# Patient Record
Sex: Female | Born: 1975 | Hispanic: Yes | Marital: Married | Smoking: Former smoker
Health system: Southern US, Community
[De-identification: ages and names within clinical notes are randomized; demographics above are authoritative.]

## PROBLEM LIST (undated history)

## (undated) DIAGNOSIS — T7840XA Allergy, unspecified, initial encounter: Secondary | ICD-10-CM

## (undated) DIAGNOSIS — N912 Amenorrhea, unspecified: Secondary | ICD-10-CM

## (undated) HISTORY — DX: Amenorrhea, unspecified: N91.2

## (undated) HISTORY — PX: OTHER SURGICAL HISTORY: SHX169

## (undated) HISTORY — DX: Allergy, unspecified, initial encounter: T78.40XA

---

## 2005-12-22 HISTORY — PX: TUBAL LIGATION: SHX77

## 2016-06-23 ENCOUNTER — Emergency Department (HOSPITAL_COMMUNITY)
Admission: EM | Admit: 2016-06-23 | Discharge: 2016-06-23 | Disposition: A | Discharge status: Home / Self Care | Payer: BLUE CROSS/BLUE SHIELD | Attending: Emergency Medicine | Admitting: Emergency Medicine

## 2016-06-23 ENCOUNTER — Encounter (HOSPITAL_COMMUNITY): Payer: Self-pay | Admitting: Emergency Medicine

## 2016-06-23 ENCOUNTER — Emergency Department (HOSPITAL_COMMUNITY): Payer: BLUE CROSS/BLUE SHIELD

## 2016-06-23 DIAGNOSIS — Y999 Unspecified external cause status: Secondary | ICD-10-CM | POA: Diagnosis not present

## 2016-06-23 DIAGNOSIS — S8392XA Sprain of unspecified site of left knee, initial encounter: Secondary | ICD-10-CM | POA: Insufficient documentation

## 2016-06-23 DIAGNOSIS — Y93B9 Activity, other involving muscle strengthening exercises: Secondary | ICD-10-CM | POA: Insufficient documentation

## 2016-06-23 DIAGNOSIS — Y929 Unspecified place or not applicable: Secondary | ICD-10-CM | POA: Insufficient documentation

## 2016-06-23 DIAGNOSIS — X500XXA Overexertion from strenuous movement or load, initial encounter: Secondary | ICD-10-CM | POA: Diagnosis not present

## 2016-06-23 DIAGNOSIS — S8992XA Unspecified injury of left lower leg, initial encounter: Secondary | ICD-10-CM | POA: Diagnosis present

## 2016-06-23 MED ORDER — KETOROLAC TROMETHAMINE 15 MG/ML IJ SOLN
15.0000 mg | Freq: Once | INTRAMUSCULAR | Status: AC
  Administered 2016-06-23: 15 mg via INTRAMUSCULAR
  Filled 2016-06-23: qty 1

## 2016-06-23 MED ORDER — HYDROCODONE-ACETAMINOPHEN 5-325 MG PO TABS: 1.0000 | ORAL_TABLET | Freq: Four times a day (QID) | ORAL | Status: DC | PRN

## 2016-06-23 MED ORDER — HYDROCODONE-ACETAMINOPHEN 5-325 MG PO TABS
1.0000 | ORAL_TABLET | Freq: Once | ORAL | Status: AC
  Administered 2016-06-23: 1 via ORAL
  Filled 2016-06-23: qty 1

## 2016-06-23 MED ORDER — IBUPROFEN 600 MG PO TABS: 600.0000 mg | ORAL_TABLET | Freq: Four times a day (QID) | ORAL | Status: DC | PRN

## 2016-06-23 NOTE — ED Provider Notes (Signed)
CSN: [id number]     Arrival date & time 06/23/16  0428 History   First MD Initiated Contact with Patient 06/23/16 [phone]     Chief Complaint  Patient presents with  . Knee Injury     (Consider location/radiation/quality/duration/timing/severity/associated sxs/prior Treatment) HPI  This is a 40 year old female with no significant past medical history who presents with left knee pain. Patient reports that she did cross fit for the first time yesterday. 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  Patient reports after the workout she had progressive left knee pain. Pain located posterior and laterally over the knee. She states when she bears weight is 10 out of 10. She took 600 mg of ibuprofen with minimal relief. Pain worsened throughout the night. She denies any obvious injuries.  History reviewed. No pertinent past medical history. History reviewed. No pertinent past surgical history. No family history on file. Social History  Substance Use Topics  . Smoking status: Never Smoker   . Smokeless tobacco: None  . Alcohol Use: No   OB History    No data available     Review of Systems  Musculoskeletal:       Left knee pain  Neurological: Negative for weakness and numbness.  All other systems reviewed and are negative.     Allergies  Review of patient's allergies indicates no known allergies.  Home Medications   Prior to Admission medications   Medication Sig Start Date End Date Taking? Authorizing Provider  HYDROcodone-acetaminophen (NORCO/VICODIN) 5-325 MG tablet Take 1 tablet by mouth every 6 (six) hours as needed for moderate pain. 06/23/16   Shon Batonourtney F Zaylee Cornia, MD  ibuprofen (ADVIL,MOTRIN) 600 MG tablet Take 1 tablet (600 mg total) by mouth every 6 (six) hours as needed. 06/23/16   Shon Batonourtney F Karra Pink, MD   BP 103/64 mmHg  Pulse 68  Temp(Src) 98.4 F (36.9 C) (Oral)  Resp 18  SpO2 98%  LMP 06/04/2016 (Exact Date) Physical Exam  Constitutional:  She is oriented to person, place, and time. She appears well-developed and well-nourished.  HENT:  Head: Normocephalic and atraumatic.  Cardiovascular: Normal rate and regular rhythm.   Pulmonary/Chest: Effort normal. No respiratory distress.  Musculoskeletal:  Normal range of motion of the left knee, pain with extension, tenderness palpation over the lateral joint line and distal IT band, no obvious swelling or overlying skin changes, no significant joint laxity noted  Neurological: She is alert and oriented to person, place, and time.  Skin: Skin is warm and dry.  Psychiatric: She has a normal mood and affect.  Nursing note and vitals reviewed.   ED Course  Procedures (including critical care time) Labs Review Labs Reviewed - No data to display  Imaging Review Dg Knee Complete 4 Views Left  06/23/2016  CLINICAL DATA:  Initial evaluation for recent knee injury. Now with difficulty bearing weight, late extension. EXAM: LEFT KNEE - COMPLETE 4+ VIEW COMPARISON:  None. FINDINGS: No evidence of fracture, dislocation, or joint effusion. No evidence of arthropathy or other focal bone abnormality. Soft tissues are unremarkable. IMPRESSION: No acute osseous abnormality about the knee. Electronically Signed   By: Rise MuBenjamin  McClintock M.D.   On: 06/23/2016 05:55   I have personally reviewed and evaluated these images and lab results as part of my medical decision-making.   EKG Interpretation None      MDM   Final diagnoses:  Knee sprain, left, initial encounter    Patient presents with left knee pain. Nontoxic on exam.  X-rays negative for fracture. Suspect sprain versus ligament injury versus meniscus injury. Patient had improvement of pain with ibuprofen and Norco. She is able to bear weight but continues to endorse pain with weightbearing. Supportive measures encouraged including rest, ice, compression, elevation, anti-inflammatory medications. She will also be given a short course of  Norco. Follow-up with primary physician in one week if not improving. Patient may need an outpatient MRI for definitive evaluation.  After history, exam, and medical workup I feel the patient has been appropriately medically screened and is safe for discharge home. Pertinent diagnoses were discussed with the patient. Patient was given return precautions.     Shon Batonourtney F Amarrah Meinhart, MD 06/23/16 [phone]

## 2016-06-23 NOTE — ED Notes (Signed)
Pt and family understood dc material. NAD noted. Script, crutcheas and paperwork given at DC

## 2016-06-23 NOTE — ED Notes (Signed)
Pt was doing crossfit. Knee was sore afterwards but she could bear weight. Today pt unable to bear weight to left knee

## 2016-06-23 NOTE — Discharge Instructions (Signed)

## 2017-06-30 ENCOUNTER — Ambulatory Visit (INDEPENDENT_AMBULATORY_CARE_PROVIDER_SITE_OTHER): Payer: BLUE CROSS/BLUE SHIELD | Admitting: Family Medicine

## 2017-06-30 ENCOUNTER — Encounter: Payer: Self-pay | Admitting: Family Medicine

## 2017-06-30 VITALS — BP 106/76 | HR 78 | Resp 12 | Ht 63.0 in | Wt 124.5 lb

## 2017-06-30 DIAGNOSIS — J309 Allergic rhinitis, unspecified: Secondary | ICD-10-CM

## 2017-06-30 DIAGNOSIS — L709 Acne, unspecified: Secondary | ICD-10-CM

## 2017-06-30 MED ORDER — ERYTHROMYCIN 2 % EX SOLN: Freq: Every day | CUTANEOUS | 2 refills | Status: DC

## 2017-06-30 MED ORDER — TRETINOIN 0.025 % EX CREA: TOPICAL_CREAM | Freq: Every day | CUTANEOUS | 1 refills | Status: DC

## 2017-06-30 NOTE — Progress Notes (Signed)
HPI:   Karen Johns is a 41 y.o. female, who is here today to establish care with me.  Former PCP: N/A Last preventive routine visit: 2015.  Chronic medical problems: Otherwise healthy except for mild allergies, for which she takes OTC Cetirizine 10 mg as needed.  She follows a healthy diet and rigorous exercise program, she is a Clinical cytogeneticist.  Concerns today: Facial skin lesions, intermittently for 1-2 years, she thinks it is related to allergies because she has had it since she moved to [place]. She has some acne on mandibular areas with her menstrual periods but frontal lesions are present intermittently all month. She tries to wash her face frequently and uses OTC moisturizers but these have not helped.  She has not started new medication, detergent, soap, or body product. No known insect bite or outdoor exposures to plants.   No associated oral lesions/edema,cough, wheezing, dyspnea, abdominal pain, nausea, or vomiting.   Review of Systems  Constitutional: Negative for appetite change, chills, fatigue and fever.  HENT: Positive for postnasal drip and sinus pressure (occasional). Negative for ear pain, mouth sores, nosebleeds, sneezing, sore throat, trouble swallowing and voice change.   Eyes: Negative for discharge and redness.  Respiratory: Negative for cough, shortness of breath and wheezing.   Cardiovascular: Negative for chest pain, palpitations and leg swelling.  Gastrointestinal: Negative for abdominal pain, diarrhea, nausea and vomiting.  Endocrine: Negative for cold intolerance and heat intolerance.  Genitourinary: Negative for decreased urine volume and menstrual problem.  Musculoskeletal: Negative for arthralgias and myalgias.  Skin: Positive for rash.  Allergic/Immunologic: Positive for environmental allergies. Negative for food allergies.  Neurological: Negative for weakness and headaches.  Hematological: Negative for  adenopathy. Does not bruise/bleed easily.  Psychiatric/Behavioral: Negative for sleep disturbance. The patient is not nervous/anxious.      No current outpatient prescriptions on file prior to visit.   No current facility-administered medications on file prior to visit.      Past Medical History:  Diagnosis Date  . Allergy    Allergies  Allergen Reactions  . Shellfish Allergy     Family History  Problem Relation Age of Onset  . Diabetes Father   . Diabetes Paternal Aunt   . Diabetes Paternal Uncle   . Cancer Maternal Grandmother        breast  . Diabetes Paternal Grandfather     Social History   Social History  . Marital status: Single    Spouse name: N/A  . Number of children: N/A  . Years of education: N/A   Social History Main Topics  . Smoking status: Former Games developer  . Smokeless tobacco: Never Used  . Alcohol use No  . Drug use: No  . Sexual activity: Yes    Birth control/ protection: Surgical   Other Topics Concern  . None   Social History Narrative  . None    Vitals:   06/30/17 1435  BP: 106/76  Pulse: 78  Resp: 12   O2 sat at RA 97% Body mass index is 22.05 kg/m.   Physical Exam  Nursing note and vitals reviewed. Constitutional: She is oriented to person, place, and time. She appears well-developed and well-nourished. No distress.  HENT:  Head: Atraumatic.  Mouth/Throat: Oropharynx is clear and moist and mucous membranes are normal.  Eyes: Pupils are equal, round, and reactive to light. Conjunctivae and EOM are normal.  Neck: No tracheal deviation present. No thyroid mass and no thyromegaly  present.  Cardiovascular: Normal rate and regular rhythm.   No murmur heard. Pulses:      Dorsalis pedis pulses are 2+ on the right side, and 2+ on the left side.  Respiratory: Effort normal and breath sounds normal. No respiratory distress.  GI: Soft. She exhibits no mass. There is no hepatomegaly. There is no tenderness.  Musculoskeletal: She  exhibits no edema or tenderness.  Lymphadenopathy:    She has no cervical adenopathy.  Neurological: She is alert and oriented to person, place, and time. She has normal strength. Coordination and gait normal.  Skin: Skin is warm. Rash noted. No ecchymosis noted. Rash is papular and pustular. No erythema.     Small pustular lesions scattered on frontal and temporal areas. Scarring changes on checks.  Psychiatric: She has a normal mood and affect.  Well groomed, good eye contact.      ASSESSMENT AND PLAN:   Ms. Karen Johns was seen today for establish care.  Diagnoses and all orders for this visit:  Acne, unspecified acne type  We discussed Dx and treatment options, will start with topical abx and Retinol. Some side effects discussed. Sun screen and mechanical protection from direct sun light also encouraged. Cetaphil cleanser recommended. F/U in 3 months.  -     erythromycin with ethanol (THERAMYCIN) 2 % external solution; Apply topically daily. -     tretinoin (RETIN-A) 0.025 % cream; Apply topically at bedtime.  Allergic rhinitis, unspecified seasonality, unspecified trigger  Mild. Continue OTC Cetirizine 10 mg as needed. F/U as needed.     Shacola Schussler G. [place], MD  [facility]. [place] office.

## 2017-06-30 NOTE — Patient Instructions (Addendum)
A few things to remember from today's visit:   Acne, unspecified acne type - Plan: erythromycin with ethanol (THERAMYCIN) 2 % external solution, tretinoin (RETIN-A) 0.025 % cream  Sun screen.  Please be sure medication list is accurate. If a new problem present, please set up appointment sooner than planned today.

## 2017-07-03 ENCOUNTER — Telehealth: Payer: Self-pay

## 2017-07-03 NOTE — Telephone Encounter (Signed)
Received PA request for Tretinoin. PA submitted & approved. Form faxed back to pharmacy. 

## 2017-09-28 NOTE — Progress Notes (Signed)
HPI:  Karen Johns is a 41 y.o.female here today for his routine physical examination.  Last CPE: 07/2013 He lives with her fiance.  Regular exercise 3 or more times per week: Yes, she follows a rigorous exercise program. She is getting ready for body builder competition in 10/2017, following 1700 cal/day diet. Following a healthy diet: Yes.   Chronic medical problems: Allergic rhinitis and acne.  Currently she is on topical Erythromycin and Tretinoin, tolerating both medications well. Facial acne well controlled with these medications.  Hx of STD's: Denies Last pap smear: 07/2013 with neg HPV Hx of abnormal pap smears: Denies  LMP: 2 weeks ago. G: 2 L: 2 She breast fed. S/P BTL.  Denies high alcohol intake or Hx of illicit drug use. Former smoker.  She has no concerns today.   Review of Systems  Constitutional: Negative for appetite change, fatigue, fever and unexpected weight change.  HENT: Negative for dental problem, hearing loss, nosebleeds, sore throat, trouble swallowing and voice change.   Eyes: Negative for redness and visual disturbance.  Respiratory: Negative for cough, shortness of breath and wheezing.   Cardiovascular: Negative for chest pain, palpitations and leg swelling.  Gastrointestinal: Negative for abdominal pain, blood in stool, nausea and vomiting.       No changes in bowel habits.  Endocrine: Negative for cold intolerance, heat intolerance, polydipsia, polyphagia and polyuria.  Genitourinary: Negative for decreased urine volume, dysuria, hematuria, menstrual problem, vaginal bleeding and vaginal discharge.       No breast tenderness or nipple discharge.  Musculoskeletal: Negative for arthralgias and back pain.  Skin: Negative for pallor and rash.  Allergic/Immunologic: Positive for environmental allergies.  Neurological: Negative for syncope, weakness, numbness and headaches.  Hematological: Negative for adenopathy. Does not bruise/bleed  easily.  Psychiatric/Behavioral: Negative for confusion and sleep disturbance. The patient is not nervous/anxious.   All other systems reviewed and are negative.    No current outpatient prescriptions on file prior to visit.   No current facility-administered medications on file prior to visit.      Past Medical History:  Diagnosis Date  . Allergy     Past Surgical History:  Procedure Laterality Date  . CESAREAN SECTION     02/03/2004 and 12/22/2005  . TUBAL LIGATION Bilateral 12/22/2005    Allergies  Allergen Reactions  . Shellfish Allergy     Family History  Problem Relation Age of Onset  . Diabetes Father   . Diabetes Paternal Aunt   . Diabetes Paternal Uncle   . Cancer Maternal Grandmother        breast  . Diabetes Paternal Grandfather     Social History   Social History  . Marital status: Single    Spouse name: N/A  . Number of children: N/A  . Years of education: N/A   Social History Main Topics  . Smoking status: Former Games developer  . Smokeless tobacco: Never Used  . Alcohol use No  . Drug use: No  . Sexual activity: Yes    Birth control/ protection: Surgical   Other Topics Concern  . None   Social History Narrative  . None     Vitals:   09/29/17 0854  BP: 102/70  Pulse: 68  Resp: 12  SpO2: 97%   Body mass index is 21.48 kg/m.   Wt Readings from Last 3 Encounters:  09/29/17 121 lb 4 oz (55 kg)  06/30/17 124 lb 8 oz (56.5 kg)    Physical  Exam  Nursing note and vitals reviewed. Constitutional: She is oriented to person, place, and time. She appears well-developed and well-nourished. No distress.  HENT:  Head: Normocephalic and atraumatic.  Right Ear: Hearing, tympanic membrane, external ear and ear canal normal.  Left Ear: Hearing, tympanic membrane, external ear and ear canal normal.  Mouth/Throat: Uvula is midline, oropharynx is clear and moist and mucous membranes are normal.  Eyes: Pupils are equal, round, and reactive to light.  Conjunctivae and EOM are normal.  Neck: No tracheal deviation present. No thyroid mass and no thyromegaly present.  Cardiovascular: Normal rate and regular rhythm.   No murmur heard. Pulses:      Dorsalis pedis pulses are 2+ on the right side, and 2+ on the left side.  Respiratory: Effort normal and breath sounds normal. No respiratory distress.  GI: Soft. She exhibits no mass. There is no hepatomegaly. There is no tenderness.  Genitourinary: No breast swelling or tenderness.  Genitourinary Comments: Breast: Mild fibrocystic changes in outer upper quadrants bilateral. No nipple discharge or skin changes.  Musculoskeletal: She exhibits no edema or tenderness.  No major deformity or signs of synovitis appreciated.  Lymphadenopathy:    She has no cervical adenopathy.    She has no axillary adenopathy.       Right: No supraclavicular adenopathy present.       Left: No supraclavicular adenopathy present.  Neurological: She is alert and oriented to person, place, and time. She has normal strength. No cranial nerve deficit or sensory deficit. Coordination and gait normal.  Reflex Scores:      Bicep reflexes are 2+ on the right side and 2+ on the left side.      Patellar reflexes are 2+ on the right side and 2+ on the left side. Skin: Skin is warm. No rash noted. No erythema.  Several tattoos on skin.  Psychiatric: She has a normal mood and affect. Cognition and memory are normal.  Well groomed, good eye contact.    ASSESSMENT AND PLAN:   Ms. Odaliz was seen today for annual exam.  Diagnoses and all orders for this visit:  Routine general medical examination at a health care facility  We discussed the importance of regular physical activity and healthy diet for prevention of chronic illness and/or complications. Preventive guidelines reviewed. She is due for pap smear next year. Instructed to let me know if she has any pelvic pain,chnages in menses,or discharge. Vaccination up to date.  Refused influenza vaccine. Next CPE in a year.  Screening for lipid disorders -     Lipid panel  Diabetes mellitus screening -     Basic metabolic panel  Encounter for screening for HIV -     HIV antibody  Acne, unspecified acne type  Well controlled. No changes in current management. F/U in 12 months.  -     erythromycin with ethanol (THERAMYCIN) 2 % external solution; Apply topically daily. -     tretinoin (RETIN-A) 0.025 % cream; Apply topically at bedtime.    Return in 1 year (on 09/29/2018) for CPE with pap.    Betty G. [place], MD  [facility]. [place] office.

## 2017-09-29 ENCOUNTER — Ambulatory Visit (INDEPENDENT_AMBULATORY_CARE_PROVIDER_SITE_OTHER): Payer: BLUE CROSS/BLUE SHIELD | Admitting: Family Medicine

## 2017-09-29 ENCOUNTER — Encounter: Payer: Self-pay | Admitting: Family Medicine

## 2017-09-29 VITALS — BP 102/70 | HR 68 | Resp 12 | Ht 63.0 in | Wt 121.2 lb

## 2017-09-29 DIAGNOSIS — Z131 Encounter for screening for diabetes mellitus: Secondary | ICD-10-CM

## 2017-09-29 DIAGNOSIS — Z0000 Encounter for general adult medical examination without abnormal findings: Secondary | ICD-10-CM

## 2017-09-29 DIAGNOSIS — Z13220 Encounter for screening for lipoid disorders: Secondary | ICD-10-CM | POA: Diagnosis not present

## 2017-09-29 DIAGNOSIS — L709 Acne, unspecified: Secondary | ICD-10-CM

## 2017-09-29 DIAGNOSIS — Z114 Encounter for screening for human immunodeficiency virus [HIV]: Secondary | ICD-10-CM | POA: Diagnosis not present

## 2017-09-29 LAB — BASIC METABOLIC PANEL
BUN: 26 mg/dL — ABNORMAL HIGH (ref 6–23)
CO2: 27 mEq/L (ref 19–32)
Calcium: 8.8 mg/dL (ref 8.4–10.5)
Chloride: 104 mEq/L (ref 96–112)
Creatinine, Ser: 1.2 mg/dL (ref 0.40–1.20)
GFR: 53.08 mL/min — ABNORMAL LOW (ref >60.00)
Glucose, Bld: 86 mg/dL (ref 70–99)
Potassium: 4.4 mEq/L (ref 3.5–5.1)
Sodium: 139 mEq/L (ref 135–145)

## 2017-09-29 LAB — LIPID PANEL
Cholesterol: 152 mg/dL (ref 0–200)
HDL: 65.4 mg/dL (ref >39.00)
LDL Cholesterol: 78 mg/dL (ref 0–99)
NonHDL: 86.37
Total CHOL/HDL Ratio: 2
Triglycerides: 41 mg/dL (ref 0.0–149.0)
VLDL: 8.2 mg/dL (ref 0.0–40.0)

## 2017-09-29 MED ORDER — ERYTHROMYCIN 2 % EX SOLN: Freq: Every day | CUTANEOUS | 6 refills | Status: DC

## 2017-09-29 MED ORDER — TRETINOIN 0.025 % EX CREA: TOPICAL_CREAM | Freq: Every day | CUTANEOUS | 3 refills | Status: DC

## 2017-09-29 NOTE — Patient Instructions (Addendum)
A few things to remember from today's visit:   Routine general medical examination at a health care facility  Screening for lipid disorders - Plan: Lipid panel  Diabetes mellitus screening - Plan: Basic metabolic panel  Encounter for screening for HIV - Plan: HIV antibody   Please be sure medication list is accurate. If a new problem present, please set up appointment sooner than planned today.   Today you have you routine preventive visit.  At least 150 minutes of moderate exercise per week, daily brisk walking for 15-30 min is a good exercise option. Healthy diet low in saturated (animal) fats and sweets and consisting of fresh fruits and vegetables, lean meats such as fish and white chicken and whole grains.  These are some of recommendations for screening depending of age and risk factors:   - Vaccines:  Tdap vaccine every 10 years.  Shingles vaccine recommended at age 79, could be given after 39 years of age but not sure about insurance coverage.   Pneumonia vaccines:  Prevnar 13 at 65 and Pneumovax at 66. Sometimes Pneumovax is giving earlier if history of smoking, lung disease,diabetes,kidney disease among some.    Screening for diabetes at age 76 and every 3 years.  Cervical cancer prevention:  Pap smear starts at 39 years of age and continues periodically until 39 years old in low risk women. Pap smear every 3 years between 91 and 31 years old. Pap smear every 3-5 years between women 30 and older if pap smear negative and HPV screening negative.   -Breast cancer: Mammogram: There is disagreement between experts about when to start screening in low risk asymptomatic female but recent recommendations are to start screening at 70 and not later than 39 years old , every 1-2 years and after 39 yo q 2 years. Screening is recommended until 39 years old but some women can continue screening depending of healthy issues.   Colon cancer screening: starts at 39 years old  until 39 years old.  Cholesterol disorder screening at age 65 and every 3 years.  Also recommended:  1. Dental visit- Brush and floss your teeth twice daily; visit your dentist twice a year. 2. Eye doctor- Get an eye exam at least every 2 years. 3. Helmet use- Always wear a helmet when riding a bicycle, motorcycle, rollerblading or skateboarding. 4. Safe sex- If you may be exposed to sexually transmitted infections, use a condom. 5. Seat belts- Seat belts can save your live; always wear one. 6. Smoke/Carbon Monoxide detectors- These detectors need to be installed on the appropriate level of your home. Replace batteries at least once a year. 7. Skin cancer- When out in the sun please cover up and use sunscreen 15 SPF or higher. 8. Violence- If anyone is threatening or hurting you, please tell your healthcare provider.  9. Drink alcohol in moderation- Limit alcohol intake to one drink or less per day. Never drink and drive.

## 2017-09-30 LAB — HIV ANTIBODY (ROUTINE TESTING W REFLEX): HIV 1&2 Ab, 4th Generation: NONREACTIVE

## 2017-10-02 ENCOUNTER — Other Ambulatory Visit: Payer: Self-pay | Admitting: Family Medicine

## 2017-10-02 ENCOUNTER — Encounter: Payer: Self-pay | Admitting: Family Medicine

## 2017-10-02 DIAGNOSIS — R944 Abnormal results of kidney function studies: Secondary | ICD-10-CM

## 2017-11-17 ENCOUNTER — Encounter: Payer: BLUE CROSS/BLUE SHIELD | Admitting: Family Medicine

## 2018-03-27 ENCOUNTER — Ambulatory Visit: Payer: BLUE CROSS/BLUE SHIELD | Admitting: Family Medicine

## 2018-03-27 ENCOUNTER — Encounter: Payer: Self-pay | Admitting: Family Medicine

## 2018-03-27 VITALS — BP 122/66 | HR 85 | Temp 98.8°F | Resp 12 | Ht 63.0 in | Wt 132.4 lb

## 2018-03-27 DIAGNOSIS — G5601 Carpal tunnel syndrome, right upper limb: Secondary | ICD-10-CM

## 2018-03-27 DIAGNOSIS — M79601 Pain in right arm: Secondary | ICD-10-CM

## 2018-03-27 NOTE — Patient Instructions (Signed)
A few things to remember from today's visit:   Carpal tunnel syndrome of right wrist - Plan: Ambulatory referral to Orthopedic Surgery  Pain of right upper extremity   Carpal Tunnel Syndrome Carpal tunnel syndrome is a condition that causes pain in your hand and arm. The carpal tunnel is a narrow area that is on the palm side of your wrist. Repeated wrist motion or certain diseases may cause swelling in the tunnel. This swelling can pinch the main nerve in the wrist (median nerve). Follow these instructions at home: If you have a splint:  Wear it as told by your doctor. Remove it only as told by your doctor.  Loosen the splint if your fingers: ? Become numb and tingle. ? Turn blue and cold.  Keep the splint clean and dry. General instructions  Take over-the-counter and prescription medicines only as told by your doctor.  Rest your wrist from any activity that may be causing your pain. If needed, talk to your employer about changes that can be made in your work, such as getting a wrist pad to use while typing.  If directed, apply ice to the painful area: ? Put ice in a plastic bag. ? Place a towel between your skin and the bag. ? Leave the ice on for 20 minutes, 2-3 times per day.  Keep all follow-up visits as told by your doctor. This is important.  Do any exercises as told by your doctor, physical therapist, or occupational therapist. Contact a doctor if:  You have new symptoms.  Medicine does not help your pain.  Your symptoms get worse. This information is not intended to replace advice given to you by your health care provider. Make sure you discuss any questions you have with your health care provider. Document Released: 11/24/2011 Document Revised: 05/12/2016 Document Reviewed: 04/22/2015 Elsevier Interactive Patient Education  2018 [employer]  Please be sure medication list is accurate. If a new problem present, please set up appointment sooner than planned  today.

## 2018-03-27 NOTE — Progress Notes (Signed)
ACUTE VISIT   HPI:  Chief Complaint  Patient presents with  . Wrist Pain    sx started years ago, have been getting worse since February. Pain and numbness radiates to shoulder sometimes. Unable to sleep due to pain for the last 3 weeks    Karen Johns is a 42 y.o. female, who is here today complaining of worsening right wrist pain and hand numbness. She has had problem intermittently for about 8 years,usually alleviated by shaking her hand and did not have problems during the day. It is progressively getting worse sine 01/2018 and for the past 3 weeks having symptoms during the day: Severe burning and numbness of fingers,hand,wrist and radiated to forearm and arm, under right shoulder.5-7/10, constant. Interfering with her sleep, seems to be worse at night. She has noted that her drip is weaker.  No rash or edema. No recent trauma.  "Tightness" on neck ,no limitation of ROM.  She is a Engineer, production. Right handed.  She has tried Ibuprofen,night splint, bandage,and local ice but neither of these treatment seem to help.    Review of Systems  Constitutional: Negative for chills, fatigue and fever.  HENT: Negative for mouth sores, sore throat and trouble swallowing.   Respiratory: Negative for cough, shortness of breath and wheezing.   Gastrointestinal: Negative for nausea and vomiting.  Musculoskeletal: Positive for arthralgias. Negative for gait problem and joint swelling.  Skin: Negative for pallor and rash.  Neurological: Positive for weakness and numbness. Negative for tremors and headaches.  Psychiatric/Behavioral: Positive for sleep disturbance. Negative for confusion. The patient is not nervous/anxious.       Current Outpatient Medications on File Prior to Visit  Medication Sig Dispense Refill  . erythromycin with ethanol (THERAMYCIN) 2 % external solution Apply topically daily. 60 mL 6  . Multiple Vitamins-Minerals (MULTIVITAMIN ADULT  PO) Take by mouth daily.    Marland Kitchen tretinoin (RETIN-A) 0.025 % cream Apply topically at bedtime. 45 g 3   No current facility-administered medications on file prior to visit.      Past Medical History:  Diagnosis Date  . Allergy    Allergies  Allergen Reactions  . Shellfish Allergy     Social History   Socioeconomic History  . Marital status: Single    Spouse name: Not on file  . Number of children: Not on file  . Years of education: Not on file  . Highest education level: Not on file  Occupational History  . Not on file  Social Needs  . Financial resource strain: Not on file  . Food insecurity:    Worry: Not on file    Inability: Not on file  . Transportation needs:    Medical: Not on file    Non-medical: Not on file  Tobacco Use  . Smoking status: Former Games developer  . Smokeless tobacco: Never Used  Substance and Sexual Activity  . Alcohol use: No  . Drug use: No  . Sexual activity: Yes    Birth control/protection: Surgical  Lifestyle  . Physical activity:    Days per week: Not on file    Minutes per session: Not on file  . Stress: Not on file  Relationships  . Social connections:    Talks on phone: Not on file    Gets together: Not on file    Attends religious service: Not on file    Active member of club or organization: Not on file  Attends meetings of clubs or organizations: Not on file    Relationship status: Not on file  Other Topics Concern  . Not on file  Social History Narrative  . Not on file    Vitals:   03/27/18 1502  BP: 122/66  Pulse: 85  Resp: 12  Temp: 98.8 F (37.1 C)  SpO2: 98%   Body mass index is 23.45 kg/m.    Physical Exam  Nursing note and vitals reviewed. Constitutional: She is oriented to person, place, and time. She appears well-developed and well-nourished. She does not appear ill. No distress.  HENT:  Head: Normocephalic and atraumatic.  Eyes: Conjunctivae are normal.  Cardiovascular: Normal rate and regular  rhythm.  No murmur heard. Pulses:      Radial pulses are 2+ on the right side.  Respiratory: Effort normal and breath sounds normal. No respiratory distress.  Musculoskeletal: She exhibits no edema.       Right shoulder: She exhibits normal range of motion, no tenderness and no bony tenderness.       Right wrist: She exhibits tenderness. She exhibits normal range of motion, no effusion and no deformity.       Cervical back: She exhibits tenderness. She exhibits no bony tenderness.       Back:       Right hand: Normal strength noted.  Tenderness upon palpation of right trapezium and cervical paraspinal muscles, mild. Right tinel and Phalen positive. No muscle atrophy appreciated.  Lymphadenopathy:    She has no cervical adenopathy.  Neurological: She is alert and oriented to person, place, and time. She has normal strength. Gait normal.  Skin: Skin is warm. No rash noted. No erythema.  Psychiatric: She has a normal mood and affect. Her speech is normal.  Well groomed, good eye contact.      ASSESSMENT AND PLAN:   [name] was seen today for wrist pain.  Diagnoses and all orders for this visit:  Pain of right upper extremity  We discussed possible etiologies. ? Radicular pain. After discussion of possible benefits of Prednisone taper ,we decided to hold on this for now. May need cervical MRI if worsening or not better after carpal tunnel treatment.  Carpal tunnel syndrome of right wrist  Further work up options options discussed (EMG) ,she prefers to go ahead with ortho evaluation. Recommend trying to avoid activities that aggravate problem but it is difficult to do so.  -     Ambulatory referral to Orthopedic Surgery      -Ms.Corinna CapraLynn Brunelle was advised to seek immediate medical attention if sudden worsening symptoms.      Destyn Parfitt G. [place], MD  [facility]. [place] office.

## 2018-03-31 ENCOUNTER — Encounter: Payer: Self-pay | Admitting: Family Medicine

## 2018-04-03 ENCOUNTER — Encounter (INDEPENDENT_AMBULATORY_CARE_PROVIDER_SITE_OTHER): Payer: Self-pay | Admitting: Orthopaedic Surgery

## 2018-04-03 ENCOUNTER — Ambulatory Visit (INDEPENDENT_AMBULATORY_CARE_PROVIDER_SITE_OTHER): Payer: BLUE CROSS/BLUE SHIELD | Admitting: Orthopaedic Surgery

## 2018-04-03 ENCOUNTER — Ambulatory Visit (INDEPENDENT_AMBULATORY_CARE_PROVIDER_SITE_OTHER): Payer: BLUE CROSS/BLUE SHIELD

## 2018-04-03 DIAGNOSIS — M79642 Pain in left hand: Secondary | ICD-10-CM | POA: Diagnosis not present

## 2018-04-03 DIAGNOSIS — M79641 Pain in right hand: Secondary | ICD-10-CM

## 2018-04-03 MED ORDER — GABAPENTIN 100 MG PO CAPS: 300.0000 mg | ORAL_CAPSULE | Freq: Every evening | ORAL | 3 refills | Status: DC | PRN

## 2018-04-03 MED ORDER — PREDNISONE 10 MG (21) PO TBPK: ORAL_TABLET | ORAL | 0 refills | Status: DC

## 2018-04-03 NOTE — Progress Notes (Signed)
Office Visit Note   Patient: Karen Johns           Date of Birth: 07/11/1976           MRN: [id number] Visit Date: 04/03/2018              Requested by: Karen Johns, Karen G, MD [address] [place], [place] [address] PCP: Karen Johns, Karen G, MD   Assessment & Plan: Visit Diagnoses:  1. Bilateral hand pain     Plan: Impression is bilateral carpal tunnel syndrome worse on the right.  Neck recommend nerve conduction studies.  Prescription for prednisone and gabapentin to take at night.  Recommend wearing carpal tunnel splints during the daytime also.  Also recommend backing down on her weight lifting and personal training as this likely exacerbates her carpal tunnel syndrome.  Follow-up after the nerve conduction studies.  Follow-Up Instructions: Return if symptoms worsen or fail to improve.   Orders:  Orders Placed This Encounter  Procedures  . Ambulatory referral to Physical Medicine Rehab   Meds ordered this encounter  Medications  . predniSONE (STERAPRED UNI-PAK 21 TAB) 10 MG (21) TBPK tablet    Sig: Take as directed    Dispense:  21 tablet    Refill:  0  . gabapentin (NEURONTIN) 100 MG capsule    Sig: Take 3 capsules (300 mg total) by mouth at bedtime as needed.    Dispense:  30 capsule    Refill:  3      Procedures: No procedures performed   Clinical Data: No additional findings.   Subjective: Chief Complaint  Patient presents with  . 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  This is been worse on the right.  Denies any injuries.  She has worn a carpal tunnel splints at night without any significant relief.  She endorses numbness in her median nerve distribution.  She is constantly shaking her hands and dropping objects.  Denies any constitutional symptoms.   Review of Systems  Constitutional:  Negative.   HENT: Negative.   Eyes: Negative.   Respiratory: Negative.   Cardiovascular: Negative.   Endocrine: Negative.   Musculoskeletal: Negative.   Neurological: Negative.   Hematological: Negative.   Psychiatric/Behavioral: Negative.   All other systems reviewed and are negative.    Objective: Vital Signs: There were no vitals taken for this visit.  Physical Exam  Constitutional: She is oriented to person, place, and time. She appears well-developed and well-nourished.  HENT:  Head: Normocephalic and atraumatic.  Eyes: EOM are normal.  Neck: Neck supple.  Pulmonary/Chest: Effort normal.  Abdominal: Soft.  Neurological: She is alert and oriented to person, place, and time.  Skin: Skin is warm. Capillary refill takes less than 2 seconds.  Psychiatric: She has a normal mood and affect. Her behavior is normal. Judgment and thought content normal.  Nursing note and vitals reviewed.   Ortho Exam Exam shows positive Phalen's, Durkin, Tinel's signs.  No muscle atrophy.  No skin lesions. Specialty Comments:  No specialty comments available.  Imaging: No results found.   PMFS History: Patient Active Problem List   Diagnosis Date Noted  . Allergic rhinitis 06/30/2017   Past Medical History:  Diagnosis Date  . Allergy     Family History  Problem Relation Age of Onset  .  Diabetes Father   . Diabetes Paternal Aunt   . Diabetes Paternal Uncle   . Cancer Maternal Grandmother        breast  . Diabetes Paternal Grandfather     Past Surgical History:  Procedure Laterality Date  . CESAREAN SECTION     02/03/2004 and 12/22/2005  . TUBAL LIGATION Bilateral 12/22/2005   Social History   Occupational History  . Not on file  Tobacco Use  . Smoking status: Former Games developermoker  . Smokeless tobacco: Never Used  Substance and Sexual Activity  . Alcohol use: No  . Drug use: No  . Sexual activity: Yes    Birth control/protection: Surgical

## 2018-04-27 ENCOUNTER — Ambulatory Visit (INDEPENDENT_AMBULATORY_CARE_PROVIDER_SITE_OTHER): Payer: BLUE CROSS/BLUE SHIELD | Admitting: Physical Medicine and Rehabilitation

## 2018-04-27 ENCOUNTER — Encounter (INDEPENDENT_AMBULATORY_CARE_PROVIDER_SITE_OTHER): Payer: Self-pay | Admitting: Physical Medicine and Rehabilitation

## 2018-04-27 DIAGNOSIS — R202 Paresthesia of skin: Secondary | ICD-10-CM

## 2018-04-27 NOTE — Progress Notes (Signed)
 .  Numeric Pain Rating Scale and Functional Assessment Average Pain 10   In the last MONTH (on 0-10 scale) has pain interfered with the following?  1. General activity like being  able to carry out your everyday physical activities such as walking, climbing stairs, carrying groceries, or moving a chair?  Rating(6)    

## 2018-04-27 NOTE — Progress Notes (Signed)
Karen Johns - 42 y.o. female MRN [id number]  Date of birth: 06/06/1976  Office Visit Note: Visit Date: 04/27/2018 PCP: [place], Betty G, MD Referred by: [place], Betty G, MD  Subjective: Chief Complaint  Patient presents with  . Right Hand - Numbness, Tingling  . Left Hand - Numbness, Tingling  . Right Forearm - Pain  . Left Forearm - Pain   HPI: Karen Johns is a pleasant 42 year old female, right-hand-dominant, who comes in today at the request of Dr. Roda Shutters for electrodiagnostic study of both upper limbs.  She works as a Systems analyst at [employer] and complains of several months of worsening bilateral hand pain and numbness which refers up to the elbows.  Most of his symptoms are in the radial 3 digits.  She has been wearing splints without much relief.  She reports that she is constantly shaking her hands and has a positive flick sign.  She does report dropping objects at times.  She does report the right is worse than the left.  She reports that the symptoms are really been off and on for at least 8 years but really became significantly worse in January.  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  She also reports that putting her hand down to her side can make the symptoms better.  She gets numbness and tingling while talking on the phone and driving.    ROS Otherwise per HPI.  Assessment & Plan: Visit Diagnoses:  1. Paresthesia of skin     Plan: No additional findings.  Impression: The above electrodiagnostic study is ABNORMAL and reveals evidence of:  1.  A moderate right median nerve entrapment at the wrist (carpal tunnel syndrome) affecting sensory and motor components.   2.  A borderline mild left median nerve entrapment at the wrist (carpal tunnel syndrome) affecting sensory components.   There is no significant electrodiagnostic evidence of any other focal nerve entrapment, brachial plexopathy or cervical radiculopathy.    As you know, this  particular electrodiagnostic study cannot rule out chemical radiculitis or sensory only radiculopathy.  Recommendations: 1.  Follow-up with referring physician. 2.  Continue current management of symptoms. 3.  Continue use of resting splint at night-time and as needed during the day. 4.  Suggest surgical evaluation.   Meds & Orders: No orders of the defined types were placed in this encounter.   Orders Placed This Encounter  Procedures  . NCV with EMG (electromyography)    Follow-up: Return for Dr. Roda Shutters.   Procedures: No procedures performed  EMG & NCV Findings: Evaluation of the right median motor nerve showed prolonged distal onset latency (4.5 ms) and decreased conduction velocity (Elbow-Wrist, 45 m/s).  The left median (across palm) sensory nerve showed prolonged distal peak latency (Wrist, 3.8 ms).  The right median (across palm) sensory nerve showed prolonged distal peak latency (Wrist, 4.7 ms) and prolonged distal peak latency (Palm, 2.9 ms).  All remaining nerves (as indicated in the following tables) were within normal limits.  Left vs. Right side comparison data for the ulnar motor nerve indicates abnormal L-R amplitude difference (29.9 %).  All remaining left vs. right side differences were within normal limits.    Needle evaluation of the right abductor pollicis brevis muscle showed increased insertional activity.  All remaining muscles (as indicated in the following table) showed no evidence of electrical instability.    Impression: The above electrodiagnostic study is ABNORMAL and reveals evidence of:  1.  A moderate  right median nerve entrapment at the wrist (carpal tunnel syndrome) affecting sensory and motor components.   2.  A borderline mild left median nerve entrapment at the wrist (carpal tunnel syndrome) affecting sensory components.   There is no significant electrodiagnostic evidence of any other focal nerve entrapment, brachial plexopathy or cervical  radiculopathy.    As you know, this particular electrodiagnostic study cannot rule out chemical radiculitis or sensory only radiculopathy.  Recommendations: 1.  Follow-up with referring physician. 2.  Continue current management of symptoms. 3.  Continue use of resting splint at night-time and as needed during the day. 4.  Suggest surgical evaluation.  Nerve Conduction Studies Anti Sensory Summary Table   Stim Site NR Peak (ms) Norm Peak (ms) P-T Amp (V) Norm P-T Amp Site1 Site2 Delta-P (ms) Dist (cm) Vel (m/s) Norm Vel (m/s)  Left Median Acr Palm Anti Sensory (2nd Digit)  32.3C  Wrist    *3.8 <3.6 20.0 >10 Wrist Palm 2.0 0.0    Palm    1.8 <2.0 20.5         Right Median Acr Palm Anti Sensory (2nd Digit)  32.4C  Wrist    *4.7 <3.6 22.4 >10 Wrist Palm 1.8 0.0    Palm    *2.9 <2.0 0.1         Left Radial Anti Sensory (Base 1st Digit)  32.5C  Wrist    2.1 <3.1 42.2  Wrist Base 1st Digit 2.1 0.0    Right Radial Anti Sensory (Base 1st Digit)  33C  Wrist    2.2 <3.1 21.7  Wrist Base 1st Digit 2.2 0.0    Left Ulnar Anti Sensory (5th Digit)  32.5C  Wrist    3.3 <3.7 25.7 >15.0 Wrist 5th Digit 3.3 14.0 42 >38  Right Ulnar Anti Sensory (5th Digit)  32.7C  Wrist    3.5 <3.7 29.2 >15.0 Wrist 5th Digit 3.5 14.0 40 >38   Motor Summary Table   Stim Site NR Onset (ms) Norm Onset (ms) O-P Amp (mV) Norm O-P Amp Site1 Site2 Delta-0 (ms) Dist (cm) Vel (m/s) Norm Vel (m/s)  Left Median Motor (Abd Poll Brev)  32.4C  Wrist    4.0 <4.2 10.0 >5 Elbow Wrist 3.7 19.0 51 >50  Elbow    7.7  10.0         Right Median Motor (Abd Poll Brev)  32.7C  Wrist    *4.5 <4.2 7.7 >5 Elbow Wrist 4.2 19.0 *45 >50  Elbow    8.7  7.4         Left Ulnar Motor (Abd Dig Min)  32.5C  Wrist    2.7 <4.2 6.8 >3 B Elbow Wrist 3.2 19.0 59 >53  B Elbow    5.9  7.1  A Elbow B Elbow 1.4 9.0 64 >53  A Elbow    7.3  7.1         Right Ulnar Motor (Abd Dig Min)  32.5C  Wrist    2.7 <4.2 9.7 >3 B Elbow Wrist 3.3 18.0 55 >53   B Elbow    6.0  9.6  A Elbow B Elbow 1.3 9.0 69 >53  A Elbow    7.3  9.5          EMG   Side Muscle Nerve Root Ins Act Fibs Psw Amp Dur Poly Recrt Int Dennie Bible Comment  Right Abd Poll Brev Median C8-T1 *Incr Nml Nml Nml Nml 0 Nml Nml   Right 1stDorInt Ulnar C8-T1  Nml Nml Nml Nml Nml 0 Nml Nml   Right PronatorTeres Median C6-7 Nml Nml Nml Nml Nml 0 Nml Nml   Right Biceps Musculocut C5-6 Nml Nml Nml Nml Nml 0 Nml Nml   Right Deltoid Axillary C5-6 Nml Nml Nml Nml Nml 0 Nml Nml     Nerve Conduction Studies Anti Sensory Left/Right Comparison   Stim Site L Lat (ms) R Lat (ms) L-R Lat (ms) L Amp (V) R Amp (V) L-R Amp (%) Site1 Site2 L Vel (m/s) R Vel (m/s) L-R Vel (m/s)  Median Acr Palm Anti Sensory (2nd Digit)  32.3C  Wrist *3.8 *4.7 0.9 20.0 22.4 10.7 Wrist Palm     Palm 1.8 *2.9 1.1 20.5 0.1 99.5       Radial Anti Sensory (Base 1st Digit)  32.5C  Wrist 2.1 2.2 0.1 42.2 21.7 48.6 Wrist Base 1st Digit     Ulnar Anti Sensory (5th Digit)  32.5C  Wrist 3.3 3.5 0.2 25.7 29.2 12.0 Wrist 5th Digit 42 40 2   Motor Left/Right Comparison   Stim Site L Lat (ms) R Lat (ms) L-R Lat (ms) L Amp (mV) R Amp (mV) L-R Amp (%) Site1 Site2 L Vel (m/s) R Vel (m/s) L-R Vel (m/s)  Median Motor (Abd Poll Brev)  32.4C  Wrist 4.0 *4.5 0.5 10.0 7.7 23.0 Elbow Wrist 51 *45 6  Elbow 7.7 8.7 1.0 10.0 7.4 26.0       Ulnar Motor (Abd Dig Min)  32.5C  Wrist 2.7 2.7 0.0 6.8 9.7 *29.9 B Elbow Wrist 59 55 4  B Elbow 5.9 6.0 0.1 7.1 9.6 26.0 A Elbow B Elbow 64 69 5  A Elbow 7.3 7.3 0.0 7.1 9.5 25.3          Waveforms:                     Clinical History: No specialty comments available.   She reports that she has quit smoking. She has never used smokeless tobacco. No results for input(s): HGBA1C, LABURIC in the last 8760 hours.  Objective:  VS:  HT:    WT:   BMI:     BP:   HR: bpm  TEMP: ( )  RESP:  Physical Exam  Musculoskeletal:  Inspection reveals no atrophy of the bilateral APB or FDI or  hand intrinsics. There is no swelling, color changes, allodynia or dystrophic changes. There is 5 out of 5 strength in the bilateral wrist extension, finger abduction and long finger flexion. There is intact sensation to light touch in all dermatomal and peripheral nerve distributions. There is a negative Phalen's test bilaterally but quicker on right. There is a negative Hoffmann's test bilaterally.    Ortho Exam Imaging: No results found.  Past Medical/Family/Surgical/Social History: Medications & Allergies reviewed per EMR, new medications updated. Patient Active Problem List   Diagnosis Date Noted  . Allergic rhinitis 06/30/2017   Past Medical History:  Diagnosis Date  . Allergy    Family History  Problem Relation Age of Onset  . Diabetes Father   . Diabetes Paternal Aunt   . Diabetes Paternal Uncle   . Cancer Maternal Grandmother        breast  . Diabetes Paternal Grandfather    Past Surgical History:  Procedure Laterality Date  . CESAREAN SECTION     02/03/2004 and 12/22/2005  . TUBAL LIGATION Bilateral 12/22/2005   Social History   Occupational History  . Not on file  Tobacco  Use  . Smoking status: Former Smoker  . Smokeless tobacco: Never Used  Substance and Sexual Activity  . Alcohol use: No  . Drug use: No  . Sexual activity: Yes    Birth control/protection: Surgical

## 2018-04-30 NOTE — Procedures (Signed)
EMG & NCV Findings: Evaluation of the right median motor nerve showed prolonged distal onset latency (4.5 ms) and decreased conduction velocity (Elbow-Wrist, 45 m/s).  The left median (across palm) sensory nerve showed prolonged distal peak latency (Wrist, 3.8 ms).  The right median (across palm) sensory nerve showed prolonged distal peak latency (Wrist, 4.7 ms) and prolonged distal peak latency (Palm, 2.9 ms).  All remaining nerves (as indicated in the following tables) were within normal limits.  Left vs. Right side comparison data for the ulnar motor nerve indicates abnormal L-R amplitude difference (29.9 %).  All remaining left vs. right side differences were within normal limits.    Needle evaluation of the right abductor pollicis brevis muscle showed increased insertional activity.  All remaining muscles (as indicated in the following table) showed no evidence of electrical instability.    Impression: The above electrodiagnostic study is ABNORMAL and reveals evidence of:  1.  A moderate right median nerve entrapment at the wrist (carpal tunnel syndrome) affecting sensory and motor components.   2.  A borderline mild left median nerve entrapment at the wrist (carpal tunnel syndrome) affecting sensory components.   There is no significant electrodiagnostic evidence of any other focal nerve entrapment, brachial plexopathy or cervical radiculopathy.    As you know, this particular electrodiagnostic study cannot rule out chemical radiculitis or sensory only radiculopathy.  Recommendations: 1.  Follow-up with referring physician. 2.  Continue current management of symptoms. 3.  Continue use of resting splint at night-time and as needed during the day. 4.  Suggest surgical evaluation.  Nerve Conduction Studies Anti Sensory Summary Table   Stim Site NR Peak (ms) Norm Peak (ms) P-T Amp (V) Norm P-T Amp Site1 Site2 Delta-P (ms) Dist (cm) Vel (m/s) Norm Vel (m/s)  Left Median Acr Palm Anti  Sensory (2nd Digit)  32.3C  Wrist    *3.8 <3.6 20.0 >10 Wrist Palm 2.0 0.0    Palm    1.8 <2.0 20.5         Right Median Acr Palm Anti Sensory (2nd Digit)  32.4C  Wrist    *4.7 <3.6 22.4 >10 Wrist Palm 1.8 0.0    Palm    *2.9 <2.0 0.1         Left Radial Anti Sensory (Base 1st Digit)  32.5C  Wrist    2.1 <3.1 42.2  Wrist Base 1st Digit 2.1 0.0    Right Radial Anti Sensory (Base 1st Digit)  33C  Wrist    2.2 <3.1 21.7  Wrist Base 1st Digit 2.2 0.0    Left Ulnar Anti Sensory (5th Digit)  32.5C  Wrist    3.3 <3.7 25.7 >15.0 Wrist 5th Digit 3.3 14.0 42 >38  Right Ulnar Anti Sensory (5th Digit)  32.7C  Wrist    3.5 <3.7 29.2 >15.0 Wrist 5th Digit 3.5 14.0 40 >38   Motor Summary Table   Stim Site NR Onset (ms) Norm Onset (ms) O-P Amp (mV) Norm O-P Amp Site1 Site2 Delta-0 (ms) Dist (cm) Vel (m/s) Norm Vel (m/s)  Left Median Motor (Abd Poll Brev)  32.4C  Wrist    4.0 <4.2 10.0 >5 Elbow Wrist 3.7 19.0 51 >50  Elbow    7.7  10.0         Right Median Motor (Abd Poll Brev)  32.7C  Wrist    *4.5 <4.2 7.7 >5 Elbow Wrist 4.2 19.0 *45 >50  Elbow    8.7  7.4  Left Ulnar Motor (Abd Dig Min)  32.5C  Wrist    2.7 <4.2 6.8 >3 B Elbow Wrist 3.2 19.0 59 >53  B Elbow    5.9  7.1  A Elbow B Elbow 1.4 9.0 64 >53  A Elbow    7.3  7.1         Right Ulnar Motor (Abd Dig Min)  32.5C  Wrist    2.7 <4.2 9.7 >3 B Elbow Wrist 3.3 18.0 55 >53  B Elbow    6.0  9.6  A Elbow B Elbow 1.3 9.0 69 >53  A Elbow    7.3  9.5          EMG   Side Muscle Nerve Root Ins Act Fibs Psw Amp Dur Poly Recrt Int Dennie Bible Comment  Right Abd Poll Brev Median C8-T1 *Incr Nml Nml Nml Nml 0 Nml Nml   Right 1stDorInt Ulnar C8-T1 Nml Nml Nml Nml Nml 0 Nml Nml   Right PronatorTeres Median C6-7 Nml Nml Nml Nml Nml 0 Nml Nml   Right Biceps Musculocut C5-6 Nml Nml Nml Nml Nml 0 Nml Nml   Right Deltoid Axillary C5-6 Nml Nml Nml Nml Nml 0 Nml Nml     Nerve Conduction Studies Anti Sensory Left/Right Comparison   Stim Site L Lat  (ms) R Lat (ms) L-R Lat (ms) L Amp (V) R Amp (V) L-R Amp (%) Site1 Site2 L Vel (m/s) R Vel (m/s) L-R Vel (m/s)  Median Acr Palm Anti Sensory (2nd Digit)  32.3C  Wrist *3.8 *4.7 0.9 20.0 22.4 10.7 Wrist Palm     Palm 1.8 *2.9 1.1 20.5 0.1 99.5       Radial Anti Sensory (Base 1st Digit)  32.5C  Wrist 2.1 2.2 0.1 42.2 21.7 48.6 Wrist Base 1st Digit     Ulnar Anti Sensory (5th Digit)  32.5C  Wrist 3.3 3.5 0.2 25.7 29.2 12.0 Wrist 5th Digit 42 40 2   Motor Left/Right Comparison   Stim Site L Lat (ms) R Lat (ms) L-R Lat (ms) L Amp (mV) R Amp (mV) L-R Amp (%) Site1 Site2 L Vel (m/s) R Vel (m/s) L-R Vel (m/s)  Median Motor (Abd Poll Brev)  32.4C  Wrist 4.0 *4.5 0.5 10.0 7.7 23.0 Elbow Wrist 51 *45 6  Elbow 7.7 8.7 1.0 10.0 7.4 26.0       Ulnar Motor (Abd Dig Min)  32.5C  Wrist 2.7 2.7 0.0 6.8 9.7 *29.9 B Elbow Wrist 59 55 4  B Elbow 5.9 6.0 0.1 7.1 9.6 26.0 A Elbow B Elbow 64 69 5  A Elbow 7.3 7.3 0.0 7.1 9.5 25.3          Waveforms:

## 2018-05-01 ENCOUNTER — Ambulatory Visit (INDEPENDENT_AMBULATORY_CARE_PROVIDER_SITE_OTHER): Payer: BLUE CROSS/BLUE SHIELD | Admitting: Orthopaedic Surgery

## 2018-05-01 ENCOUNTER — Ambulatory Visit (INDEPENDENT_AMBULATORY_CARE_PROVIDER_SITE_OTHER): Payer: Self-pay | Admitting: Physical Medicine and Rehabilitation

## 2018-05-01 ENCOUNTER — Encounter (INDEPENDENT_AMBULATORY_CARE_PROVIDER_SITE_OTHER): Payer: Self-pay | Admitting: Orthopaedic Surgery

## 2018-05-01 DIAGNOSIS — G5601 Carpal tunnel syndrome, right upper limb: Secondary | ICD-10-CM

## 2018-05-01 DIAGNOSIS — G5602 Carpal tunnel syndrome, left upper limb: Secondary | ICD-10-CM | POA: Diagnosis not present

## 2018-05-01 NOTE — Progress Notes (Signed)
   Office Visit Note   Patient: Karen Johns           Date of Birth: 12/08/1976           MRN: [id number] Visit Date: 05/01/2018              Requested by: [place], Betty G, MD [address] [place], [place] [address] PCP: [place], Betty G, MD   Assessment & Plan: Visit Diagnoses:  1. Carpal tunnel syndrome, left upper limb   2. Carpal tunnel syndrome, right upper limb     Plan: Impression is bilateral carpal tunnel syndrome right greater than left.  Patient would like to forego cortisone injection to the right carpal tunnel and proceed with surgical intervention instead.  We will set this up for the near future.  Risks, benefits and possible complications reviewed.  Rehab and recovery time discussed.  All questions were answered.  She would like to proceed with a cortisone injection on the left during time of surgery.    Follow-Up Instructions: Return for 2 weeks post-op.   Orders:  No orders of the defined types were placed in this encounter.  No orders of the defined types were placed in this encounter.     Procedures: No procedures performed   Clinical Data: No additional findings.   Subjective: Chief Complaint  Patient presents with  . Right Hand - Pain  . 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  Nerve conduction studies revealed moderate median nerve compression on the right and mild on the left.  She has tried wrist splints and gabapentin with minimal to no improvement of symptoms.  She has not had a cortisone injection to either carpal tunnel.   Review of Systems as detailed in HPI.  All others reviewed and are negative.   Objective: Vital Signs: There were no vitals taken for this visit.  Physical Exam well-developed and well-nourished female no acute distress.  Alert and oriented x3.  Ortho Exam  stable wrist exam  Specialty Comments:  No specialty comments available.  Imaging: No new imaging today   PMFS History: Patient Active Problem List   Diagnosis Date Noted  . Carpal tunnel syndrome, right upper limb 05/01/2018  . Carpal tunnel syndrome, left upper limb 05/01/2018  . Allergic rhinitis 06/30/2017   Past Medical History:  Diagnosis Date  . Allergy     Family History  Problem Relation Age of Onset  . Diabetes Father   . Diabetes Paternal Aunt   . Diabetes Paternal Uncle   . Cancer Maternal Grandmother        breast  . Diabetes Paternal Grandfather     Past Surgical History:  Procedure Laterality Date  . CESAREAN SECTION     02/03/2004 and 12/22/2005  . TUBAL LIGATION Bilateral 12/22/2005   Social History   Occupational History  . Not on file  Tobacco Use  . Smoking status: Former Games developer  . Smokeless tobacco: Never Used  Substance and Sexual Activity  . Alcohol use: No  . Drug use: No  . Sexual activity: Yes    Birth control/protection: Surgical

## 2018-05-11 DIAGNOSIS — G5601 Carpal tunnel syndrome, right upper limb: Secondary | ICD-10-CM | POA: Diagnosis not present

## 2018-05-11 DIAGNOSIS — G5602 Carpal tunnel syndrome, left upper limb: Secondary | ICD-10-CM | POA: Diagnosis not present

## 2018-05-18 ENCOUNTER — Encounter (INDEPENDENT_AMBULATORY_CARE_PROVIDER_SITE_OTHER): Payer: Self-pay | Admitting: Orthopaedic Surgery

## 2018-05-18 ENCOUNTER — Ambulatory Visit (INDEPENDENT_AMBULATORY_CARE_PROVIDER_SITE_OTHER): Payer: BLUE CROSS/BLUE SHIELD | Admitting: Orthopaedic Surgery

## 2018-05-18 DIAGNOSIS — G5601 Carpal tunnel syndrome, right upper limb: Secondary | ICD-10-CM

## 2018-05-18 NOTE — Progress Notes (Signed)
Karen Johns is 1 week status post right carpal tunnel release.  She is doing well.  She is also doing well from the left carpal tunnel injection.  She no longer has any of the symptoms in her right hand.  The incision is clean dry and intact.  We will have her follow-up next week for suture removal.  In the meantime daily dry dressing with a light layer of antibiotic ointment to the incision.  I will see her back next week.

## 2018-05-25 ENCOUNTER — Encounter (INDEPENDENT_AMBULATORY_CARE_PROVIDER_SITE_OTHER): Payer: Self-pay | Admitting: Orthopaedic Surgery

## 2018-05-25 ENCOUNTER — Ambulatory Visit (INDEPENDENT_AMBULATORY_CARE_PROVIDER_SITE_OTHER): Payer: BLUE CROSS/BLUE SHIELD | Admitting: Orthopaedic Surgery

## 2018-05-25 DIAGNOSIS — G5601 Carpal tunnel syndrome, right upper limb: Secondary | ICD-10-CM

## 2018-05-25 DIAGNOSIS — G5602 Carpal tunnel syndrome, left upper limb: Secondary | ICD-10-CM

## 2018-05-25 NOTE — Progress Notes (Signed)
   Post-Op Visit Note   Patient: Karen Johns           Date of Birth: 12/26/1975           MRN: [id number] Visit Date: 05/25/2018 PCP: [place], Betty G, MD   Assessment & Plan:  Chief Complaint:  Chief Complaint  Patient presents with  . Right Hand - Routine Post Op   Visit Diagnoses:  1. Carpal tunnel syndrome, right upper limb   2. Carpal tunnel syndrome, left upper limb     Plan: Patient is a pleasant 42 year old female who presents to our clinic today 2 weeks status post right carpal tunnel release.  She has been doing well.  Minimal pain.  No fevers or chills.  Examination of the right wrist shows a well healing surgical incision.  No evidence of infection or cellulitis.  Today, nylon sutures were removed. No lifting more than 5 pounds and no pressure over the incision for another two weeks. Follow up as needed.  Call with concerns or questions in the meantime.  Follow-Up Instructions: Return if symptoms worsen or fail to improve.   Orders:  No orders of the defined types were placed in this encounter.  No orders of the defined types were placed in this encounter.   Imaging: No results found.  PMFS History: Patient Active Problem List   Diagnosis Date Noted  . Carpal tunnel syndrome, right upper limb 05/01/2018  . Carpal tunnel syndrome, left upper limb 05/01/2018  . Allergic rhinitis 06/30/2017   Past Medical History:  Diagnosis Date  . Allergy     Family History  Problem Relation Age of Onset  . Diabetes Father   . Diabetes Paternal Aunt   . Diabetes Paternal Uncle   . Cancer Maternal Grandmother        breast  . Diabetes Paternal Grandfather     Past Surgical History:  Procedure Laterality Date  . CESAREAN SECTION     02/03/2004 and 12/22/2005  . TUBAL LIGATION Bilateral 12/22/2005   Social History   Occupational History  . Not on file  Tobacco Use  . Smoking status: Former Games developermoker  . Smokeless tobacco: Never Used  Substance and Sexual Activity    . Alcohol use: No  . Drug use: No  . Sexual activity: Yes    Birth control/protection: Surgical

## 2018-07-19 NOTE — Progress Notes (Signed)
HPI:  Karen Johns is a 42 y.o.female here today for his routine gynecologic visit.  Last CPE: 09/29/18. She lives with her fiance.  Regular exercise 3 or more times per week: daily cardio and wt lifting. Following a healthy diet: Yes.   Chronic medical problems: Allergic rhinitis,carpal tunnel synd,and acne. Since her last visit she had surgical repair of right carpal tunnel synd. Symptoms completely resolved.   Hx of STD's: Denies Last pap smear: 07/2013 with neg HPV Hx of abnormal pap smears: Denies  LMP: 2 weeks ago. G: 2 L: 2 She breast fed. S/P BTL.  Acne: Currently on topical erythromycin and retin A. No side effects and medication helping.  09/2017 mildly abnormal e GFR,53.08. BMP and urine were not done as recommended.  She takes supplement with creatinine.  She has not noted edema,gross hematuria,or foam in urine. No frequent NSAID use of Hx of HTN/DM.    Review of Systems  Constitutional: Positive for fatigue (working longer hours.). Negative for appetite change, fever and unexpected weight change.  HENT: Negative for dental problem, hearing loss, nosebleeds, trouble swallowing and voice change.   Eyes: Negative for redness and visual disturbance.  Respiratory: Negative for cough, shortness of breath and wheezing.   Cardiovascular: Negative for chest pain and leg swelling.  Gastrointestinal: Negative for abdominal pain, blood in stool, nausea and vomiting.       No changes in bowel habits.  Endocrine: Negative for cold intolerance, heat intolerance, polydipsia, polyphagia and polyuria.  Genitourinary: Negative for decreased urine volume, dysuria, hematuria, menstrual problem, vaginal bleeding and vaginal discharge.       No breast tenderness or nipple discharge.  Musculoskeletal: Negative for gait problem and myalgias.  Skin: Negative for rash.  Allergic/Immunologic: Positive for environmental allergies.  Neurological: Negative for syncope,  weakness, numbness and headaches.  Hematological: Negative for adenopathy. Does not bruise/bleed easily.  Psychiatric/Behavioral: Negative for confusion and sleep disturbance. The patient is not nervous/anxious.   All other systems reviewed and are negative.    Current Outpatient Medications on File Prior to Visit  Medication Sig Dispense Refill  . Multiple Vitamins-Minerals (MULTIVITAMIN ADULT PO) Take by mouth daily.     No current facility-administered medications on file prior to visit.      Past Medical History:  Diagnosis Date  . Allergy     Past Surgical History:  Procedure Laterality Date  . CESAREAN SECTION     02/03/2004 and 12/22/2005  . TUBAL LIGATION Bilateral 12/22/2005    Allergies  Allergen Reactions  . Shellfish Allergy     Family History  Problem Relation Age of Onset  . Diabetes Father   . Diabetes Paternal Aunt   . Diabetes Paternal Uncle   . Cancer Maternal Grandmother        breast  . Diabetes Paternal Grandfather     Social History   Socioeconomic History  . Marital status: Single    Spouse name: Not on file  . Number of children: Not on file  . Years of education: Not on file  . Highest education level: Not on file  Occupational History  . Not on file  Social Needs  . Financial resource strain: Not on file  . Food insecurity:    Worry: Not on file    Inability: Not on file  . Transportation needs:    Medical: Not on file    Non-medical: Not on file  Tobacco Use  . Smoking status: Former Games developer  .  Smokeless tobacco: Never Used  Substance and Sexual Activity  . Alcohol use: No  . Drug use: No  . Sexual activity: Yes    Birth control/protection: Surgical  Lifestyle  . Physical activity:    Days per week: Not on file    Minutes per session: Not on file  . Stress: Not on file  Relationships  . Social connections:    Talks on phone: Not on file    Gets together: Not on file    Attends religious service: Not on file    Active  member of club or organization: Not on file    Attends meetings of clubs or organizations: Not on file    Relationship status: Not on file  Other Topics Concern  . Not on file  Social History Narrative  . Not on file     Vitals:   07/20/18 0825  BP: 120/60  Pulse: 83  Resp: 12  Temp: 98.6 F (37 C)  SpO2: 98%   Body mass index is 24.64 kg/m.   Wt Readings from Last 3 Encounters:  07/20/18 139 lb 2 oz (63.1 kg)  03/27/18 132 lb 6 oz (60 kg)  09/29/17 121 lb 4 oz (55 kg)    Physical Exam  Nursing note and vitals reviewed. Constitutional: She is oriented to person, place, and time. She appears well-developed and well-nourished. No distress.  HENT:  Head: Atraumatic.  Right Ear: Hearing and external ear normal.  Left Ear: Hearing and external ear normal.  Mouth/Throat: Uvula is midline, oropharynx is clear and moist and mucous membranes are normal.  Eyes: Pupils are equal, round, and reactive to light. Conjunctivae and EOM are normal.  Neck: No tracheal deviation present. No thyromegaly present.  Cardiovascular: Normal rate and regular rhythm.  No murmur heard. Pulses:      Dorsalis pedis pulses are 2+ on the right side, and 2+ on the left side.  Respiratory: Effort normal and breath sounds normal. No respiratory distress.  GI: Soft. She exhibits no mass. There is no hepatomegaly. There is no tenderness.  Musculoskeletal: Normal range of motion. She exhibits no edema.  Lymphadenopathy:    She has no cervical adenopathy.    She has no axillary adenopathy.       Right: No supraclavicular adenopathy present.       Left: No supraclavicular adenopathy present.  Neurological: She is alert and oriented to person, place, and time. She has normal strength. No cranial nerve deficit. Coordination and gait normal.  Skin: Skin is warm. No rash noted. No erythema.  Psychiatric: She has a normal mood and affect. Her speech is normal.  Well groomed, good eye contact.     ASSESSMENT AND PLAN:   Ms. Anjeanette was seen today for annual exam.  Orders Placed This Encounter  Procedures  . MM DIGITAL SCREENING BILATERAL  . Microalbumin / creatinine urine ratio  . Basic metabolic panel   Lab Results  Component Value Date   CREATININE 1.00 07/20/2018   BUN 26 (H) 07/20/2018   NA 139 07/20/2018   K 4.4 07/20/2018   CL 104 07/20/2018   CO2 28 07/20/2018    Serum creatinine: 1 mg/dL 91/47/82 9562 Estimated creatinine clearance: 66.9 mL/min  Lab Results  Component Value Date   MICROALBUR <0.7 07/20/2018     Encounter for gynecological examination without abnormal finding  We discussed the importance of regular physical activity and healthy diet for prevention of chronic illness and/or complications. Preventive guidelines reviewed. Vaccination up  to date. Mammogram ordered. Pap smear and HPV collected today. Next CPE in a year.  Breast cancer screening -     MM DIGITAL SCREENING BILATERAL; Future  Abnormal renal function test  Adequate hydration. Avoid NSAID's. Recommended discontinuing supplement containing creatinine. Further recommendations will be given according to lab results.   -     Microalbumin / creatinine urine ratio -     Basic metabolic panel  Screening for malignant neoplasm of cervix -     Cytology - PAP ([place])  Acne, unspecified acne type  Well controlled with current management. No changes in medications. F/U annually.  -     erythromycin with ethanol (THERAMYCIN) 2 % external solution; Apply topically daily. -     tretinoin (RETIN-A) 0.025 % cream; Apply topically at bedtime.    Orders Placed This Encounter  Procedures  . MM DIGITAL SCREENING BILATERAL  . Microalbumin / creatinine urine ratio  . Basic metabolic panel    No problem-specific Assessment & Plan notes found for this encounter.     Return in 1 year (on 07/21/2019) for CPE.    Betty G. [place]Jordan, MD  [facility]. [place]  office.

## 2018-07-20 ENCOUNTER — Encounter: Payer: Self-pay | Admitting: Family Medicine

## 2018-07-20 ENCOUNTER — Ambulatory Visit (INDEPENDENT_AMBULATORY_CARE_PROVIDER_SITE_OTHER): Payer: BLUE CROSS/BLUE SHIELD | Admitting: Family Medicine

## 2018-07-20 ENCOUNTER — Other Ambulatory Visit (HOSPITAL_COMMUNITY)
Admission: RE | Admit: 2018-07-20 | Discharge: 2018-07-20 | Disposition: A | Discharge status: Home / Self Care | Payer: BLUE CROSS/BLUE SHIELD | Source: Ambulatory Visit | Attending: Family Medicine | Admitting: Family Medicine

## 2018-07-20 VITALS — BP 120/60 | HR 83 | Temp 98.6°F | Resp 12 | Ht 63.0 in | Wt 139.1 lb

## 2018-07-20 DIAGNOSIS — Z124 Encounter for screening for malignant neoplasm of cervix: Secondary | ICD-10-CM

## 2018-07-20 DIAGNOSIS — Z1239 Encounter for other screening for malignant neoplasm of breast: Secondary | ICD-10-CM

## 2018-07-20 DIAGNOSIS — R944 Abnormal results of kidney function studies: Secondary | ICD-10-CM

## 2018-07-20 DIAGNOSIS — L709 Acne, unspecified: Secondary | ICD-10-CM

## 2018-07-20 DIAGNOSIS — Z1151 Encounter for screening for human papillomavirus (HPV): Secondary | ICD-10-CM | POA: Diagnosis not present

## 2018-07-20 DIAGNOSIS — Z01419 Encounter for gynecological examination (general) (routine) without abnormal findings: Secondary | ICD-10-CM | POA: Diagnosis not present

## 2018-07-20 DIAGNOSIS — Z1231 Encounter for screening mammogram for malignant neoplasm of breast: Secondary | ICD-10-CM

## 2018-07-20 LAB — BASIC METABOLIC PANEL
BUN: 26 mg/dL — ABNORMAL HIGH (ref 6–23)
CO2: 28 mEq/L (ref 19–32)
Calcium: 9.3 mg/dL (ref 8.4–10.5)
Chloride: 104 mEq/L (ref 96–112)
Creatinine, Ser: 1 mg/dL (ref 0.40–1.20)
GFR: 65.24 mL/min (ref >60.00)
Glucose, Bld: 87 mg/dL (ref 70–99)
Potassium: 4.4 mEq/L (ref 3.5–5.1)
Sodium: 139 mEq/L (ref 135–145)

## 2018-07-20 LAB — MICROALBUMIN / CREATININE URINE RATIO
Creatinine,U: 74.7 mg/dL
Microalb Creat Ratio: 0.9 mg/g (ref 0.0–30.0)
Microalb, Ur: <0.7 mg/dL (ref 0.0–1.9)

## 2018-07-20 MED ORDER — TRETINOIN 0.025 % EX CREA: TOPICAL_CREAM | Freq: Every day | CUTANEOUS | 3 refills | Status: DC

## 2018-07-20 MED ORDER — ERYTHROMYCIN 2 % EX SOLN: Freq: Every day | CUTANEOUS | 6 refills | Status: DC

## 2018-07-20 NOTE — Patient Instructions (Addendum)
A few things to remember from today's visit:   Encounter for gynecological examination without abnormal finding  Breast cancer screening - Plan: MM DIGITAL SCREENING BILATERAL  Abnormal renal function test - Plan: Microalbumin / creatinine urine ratio, Basic metabolic panel  Screening for malignant neoplasm of cervix - Plan: Cytology - PAP ([place])  Today you have you routine preventive visit.  At least 150 minutes of moderate exercise per week, daily brisk walking for 15-30 min is a good exercise option. Healthy diet low in saturated (animal) fats and sweets and consisting of fresh fruits and vegetables, lean meats such as fish and white chicken and whole grains.  These are some of recommendations for screening depending of age and risk factors:   - Vaccines:  Tdap vaccine every 10 years.  Shingles vaccine recommended at age 760, could be given after 40 years of age but not sure about insurance coverage.   Pneumonia vaccines:  Prevnar 13 at 65 and Pneumovax at 66. Sometimes Pneumovax is giving earlier if history of smoking, lung disease,diabetes,kidney disease among some.    Screening for diabetes at age 40 and every 3 years.  Cervical cancer prevention:  Pap smear starts at 40 years of age and continues periodically until 40 years old in low risk women. Pap smear every 3 years between 421 and 40 years old. Pap smear every 3-5 years between women 30 and older if pap smear negative and HPV screening negative.   -Breast cancer: Mammogram: There is disagreement between experts about when to start screening in low risk asymptomatic female but recent recommendations are to start screening at 4840 and not later than 40 years old , every 1-2 years and after 40 yo q 2 years. Screening is recommended until 40 years old but some women can continue screening depending of healthy issues.   Colon cancer screening: starts at 40 years old until 40 years old.  Cholesterol disorder  screening at age 40 and every 3 years.  Also recommended:  1. Dental visit- Brush and floss your teeth twice daily; visit your dentist twice a year. 2. Eye doctor- Get an eye exam at least every 2 years. 3. Helmet use- Always wear a helmet when riding a bicycle, motorcycle, rollerblading or skateboarding. 4. Safe sex- If you may be exposed to sexually transmitted infections, use a condom. 5. Seat belts- Seat belts can save your live; always wear one. 6. Smoke/Carbon Monoxide detectors- These detectors need to be installed on the appropriate level of your home. Replace batteries at least once a year. 7. Skin cancer- When out in the sun please cover up and use sunscreen 15 SPF or higher. 8. Violence- If anyone is threatening or hurting you, please tell your healthcare provider.  9. Drink alcohol in moderation- Limit alcohol intake to one drink or less per day. Never drink and drive.  Please be sure medication list is accurate. If a new problem present, please set up appointment sooner than planned today.

## 2018-07-22 ENCOUNTER — Encounter: Payer: Self-pay | Admitting: Family Medicine

## 2018-07-24 ENCOUNTER — Encounter: Payer: Self-pay | Admitting: Family Medicine

## 2018-07-24 LAB — CYTOLOGY - PAP
Diagnosis: NEGATIVE
HPV: NOT DETECTED

## 2018-07-26 ENCOUNTER — Other Ambulatory Visit: Payer: Self-pay | Admitting: Family Medicine

## 2018-07-26 DIAGNOSIS — Z1231 Encounter for screening mammogram for malignant neoplasm of breast: Secondary | ICD-10-CM

## 2018-08-24 ENCOUNTER — Ambulatory Visit
Admission: RE | Admit: 2018-08-24 | Discharge: 2018-08-24 | Disposition: A | Discharge status: Home / Self Care | Payer: BLUE CROSS/BLUE SHIELD | Source: Ambulatory Visit | Attending: Family Medicine | Admitting: Family Medicine

## 2018-08-24 DIAGNOSIS — Z1231 Encounter for screening mammogram for malignant neoplasm of breast: Secondary | ICD-10-CM

## 2018-09-28 ENCOUNTER — Encounter: Payer: Self-pay | Admitting: Family Medicine

## 2018-09-28 ENCOUNTER — Ambulatory Visit: Payer: BLUE CROSS/BLUE SHIELD | Admitting: Family Medicine

## 2018-09-28 VITALS — BP 120/76 | HR 80 | Temp 98.5°F | Resp 12 | Ht 63.0 in | Wt 148.2 lb

## 2018-09-28 DIAGNOSIS — M654 Radial styloid tenosynovitis [de Quervain]: Secondary | ICD-10-CM | POA: Diagnosis not present

## 2018-09-28 DIAGNOSIS — G5602 Carpal tunnel syndrome, left upper limb: Secondary | ICD-10-CM

## 2018-09-28 DIAGNOSIS — M79602 Pain in left arm: Secondary | ICD-10-CM

## 2018-09-28 MED ORDER — PREDNISONE 20 MG PO TABS: ORAL_TABLET | ORAL | 0 refills | Status: DC

## 2018-09-28 MED ORDER — TRAMADOL HCL 50 MG PO TABS: 50.0000 mg | ORAL_TABLET | Freq: Three times a day (TID) | ORAL | 0 refills | Status: AC | PRN

## 2018-09-28 MED ORDER — IBUPROFEN 600 MG PO TABS: 600.0000 mg | ORAL_TABLET | Freq: Three times a day (TID) | ORAL | 0 refills | Status: AC | PRN

## 2018-09-28 NOTE — Patient Instructions (Signed)
A few things to remember from today's visit:   Pain of left upper extremity - Plan: traMADol (ULTRAM) 50 MG tablet, predniSONE (DELTASONE) 20 MG tablet  Left carpal tunnel syndrome - Plan: traMADol (ULTRAM) 50 MG tablet, Ambulatory referral to Orthopedic Surgery  De Quervain's tenosynovitis, left - Plan: ibuprofen (ADVIL,MOTRIN) 600 MG tablet, traMADol (ULTRAM) 50 MG tablet, Ambulatory referral to Orthopedic Surgery  Left wrist splint with thumb support. Tramadol causes drowsiness, so recommended at bedtime.   Please be sure medication list is accurate. If a new problem present, please set up appointment sooner than planned today.

## 2018-09-28 NOTE — Progress Notes (Signed)
ACUTE VISIT   HPI:  Chief Complaint  Patient presents with  . Carpal Tunnel    left hand pain    Karen Johns is a 42 y.o. female, who is here today complaining of 3-4 weeks of severe left wrist pain and hand numbness. Getting worse and keeping her from sleep. She shakes hand to alleviate symptoms. She has not identified exacerbating factors.  S/P right carpal tunnel surgical treatment. EMG showed also carpal tunnel synd left hand but moderate. She is wearing splint daily but has not helped. IShooting and sharp,constant left wrist and thumb pain, LUE feels like it is "on fire." During the day pain is 4/10 but at night 10/10.  She has not noted neck pain.  No Hx of trauma. No erythema but she has noted some hand edema. Thumb pain and burning sensation and pin like sensation that radiated to forearm and elbow.  Right handed.  She has taken Advil.   Review of Systems  Constitutional: Negative for chills and fever.  Respiratory: Negative for shortness of breath and wheezing.   Cardiovascular: Negative for chest pain.  Musculoskeletal: Positive for arthralgias. Negative for joint swelling and neck pain.  Skin: Negative for color change and pallor.  Neurological: Positive for numbness. Negative for weakness and headaches.  Psychiatric/Behavioral: Positive for sleep disturbance. The patient is not nervous/anxious.       Current Outpatient Medications on File Prior to Visit  Medication Sig Dispense Refill  . erythromycin with ethanol (THERAMYCIN) 2 % external solution Apply topically daily. 60 mL 6  . gabapentin (NEURONTIN) 100 MG capsule TAKE 3 CAPSULES BY MOUTH AT BEDTIME AS NEEDED.  3  . Multiple Vitamins-Minerals (MULTIVITAMIN ADULT PO) Take by mouth daily.    Marland Kitchen tretinoin (RETIN-A) 0.025 % cream Apply topically at bedtime. 45 g 3   No current facility-administered medications on file prior to visit.      Past Medical History:  Diagnosis Date  .  Allergy    Allergies  Allergen Reactions  . Shellfish Allergy     Social History   Socioeconomic History  . Marital status: Single    Spouse name: Not on file  . Number of children: Not on file  . Years of education: Not on file  . Highest education level: Not on file  Occupational History  . Not on file  Social Needs  . Financial resource strain: Not on file  . Food insecurity:    Worry: Not on file    Inability: Not on file  . Transportation needs:    Medical: Not on file    Non-medical: Not on file  Tobacco Use  . Smoking status: Former Games developer  . Smokeless tobacco: Never Used  Substance and Sexual Activity  . Alcohol use: No  . Drug use: No  . Sexual activity: Yes    Birth control/protection: Surgical  Lifestyle  . Physical activity:    Days per week: Not on file    Minutes per session: Not on file  . Stress: Not on file  Relationships  . Social connections:    Talks on phone: Not on file    Gets together: Not on file    Attends religious service: Not on file    Active member of club or organization: Not on file    Attends meetings of clubs or organizations: Not on file    Relationship status: Not on file  Other Topics Concern  . Not on file  Social  History Narrative  . Not on file    Vitals:   09/28/18 0828  BP: 120/76  Pulse: 80  Resp: 12  Temp: 98.5 F (36.9 C)  SpO2: 98%   Body mass index is 26.26 kg/m.    Physical Exam  Nursing note and vitals reviewed. Constitutional: She is oriented to person, place, and time. She appears well-developed and well-nourished. She does not appear ill. No distress.  HENT:  Head: Normocephalic and atraumatic.  Eyes: Conjunctivae are normal.  Cardiovascular: Normal rate and regular rhythm.  No murmur heard. Pulses:      Radial pulses are 2+ on the right side, and 2+ on the left side.  Respiratory: Effort normal. No respiratory distress.  Musculoskeletal: She exhibits tenderness. She exhibits no edema.        Left wrist: She exhibits normal range of motion.  Tenderness upon palpation of radial styloid, no edema or erythema appreciated, no limitation of wrist ROM. Pain also elicited on radial styloid with Finkelstein maneuver. + Tinel and Phalen positive left wrist.  Mild tenderness upon palpation of cervical paraspinal muscles and trapezium bilateral.  Lymphadenopathy:    She has no cervical adenopathy.  Neurological: She is alert and oriented to person, place, and time. She has normal strength. Gait normal.  Skin: Skin is warm. No rash noted. No erythema.  Psychiatric: She has a normal mood and affect.  Well groomed, good eye contact.      ASSESSMENT AND PLAN:   Karen Johns was seen today for carpal tunnel.  Diagnoses and all orders for this visit:  Pain of left upper extremity  Possible etiologies discussed. Neck muscle tenderness she attributes to achy after exercise. ? Radicular pain. After discussion of side effects she agrees with Prednisone taper. Instructed about warnings.  -     traMADol (ULTRAM) 50 MG tablet; Take 1 tablet (50 mg total) by mouth every 8 (eight) hours as needed for up to 5 days. -     predniSONE (DELTASONE) 20 MG tablet; 3 tabs for 3 days, 2 tabs for 3 days, 1 tabs for 3 days, and 1/2 tab for 3 days. Take tables together with breakfast.  Left carpal tunnel syndrome  Referral to Dr Roda Shutters placed. Continue wearing splint at night but one with thumb support. Tramadol to take at night mainly,side effects discussed.  -     traMADol (ULTRAM) 50 MG tablet; Take 1 tablet (50 mg total) by mouth every 8 (eight) hours as needed for up to 5 days. -     Ambulatory referral to Orthopedic Surgery  De Quervain's tenosynovitis, left  Educated about Dx. Referral to ortho placed. Ibuprofen tid with food, side effects discussed. Pica wrist splint.  -     ibuprofen (ADVIL,MOTRIN) 600 MG tablet; Take 1 tablet (600 mg total) by mouth every 8 (eight) hours as needed for up  to 7 days. -     traMADol (ULTRAM) 50 MG tablet; Take 1 tablet (50 mg total) by mouth every 8 (eight) hours as needed for up to 5 days. -     Ambulatory referral to Orthopedic Surgery     Return if symptoms worsen or fail to improve.     Sahand Gosch G. [place], MD  [facility]. [place] office.

## 2018-09-30 ENCOUNTER — Encounter: Payer: Self-pay | Admitting: Family Medicine

## 2018-10-04 ENCOUNTER — Ambulatory Visit (INDEPENDENT_AMBULATORY_CARE_PROVIDER_SITE_OTHER): Payer: BLUE CROSS/BLUE SHIELD | Admitting: Orthopaedic Surgery

## 2018-10-04 ENCOUNTER — Ambulatory Visit (INDEPENDENT_AMBULATORY_CARE_PROVIDER_SITE_OTHER): Payer: Self-pay

## 2018-10-04 DIAGNOSIS — M79642 Pain in left hand: Secondary | ICD-10-CM | POA: Diagnosis not present

## 2018-10-04 DIAGNOSIS — G5602 Carpal tunnel syndrome, left upper limb: Secondary | ICD-10-CM | POA: Diagnosis not present

## 2018-10-04 NOTE — Progress Notes (Addendum)
Office Visit Note   Patient: Karen Johns           Date of Birth: 09-01-1976           MRN: [id number] Visit Date: 10/04/2018              Requested by: [place], Betty G, MD [address] [place], [place] [address] PCP: [place], Betty G, MD   Assessment & Plan: Visit Diagnoses:  1. Left carpal tunnel syndrome   2. Pain in left hand     Plan: Impression is left carpal tunnel syndrome with good but temporary relief from cortisone injection.  At this point patient would like to have this definitively treated with surgical release.  She has done well from her right carpal tunnel release.  She has an upcoming competition in March which I think she should be ready for by then.  We will schedule her surgery in the near future.  Risks and benefits discussed again.  Follow-Up Instructions: Return for 2 week postop visit.   Orders:  Orders Placed This Encounter  Procedures  . XR Hand Complete Left   No orders of the defined types were placed in this encounter.     Procedures: No procedures performed   Clinical Data: No additional findings.   Subjective: Chief Complaint  Patient presents with  . Left Hand - Pain    [name] is a 42 year old female who is status post right carpal tunnel release on May 31 of this year.  She also received a left carpal tunnel injection at that time.  This injection lasted for about 3 months.  She is now having intense carpal tunnel symptoms that are keeping her from sleeping and exercising and using her left hand.  She recently took some oral prednisone which did help with some of her pain but she continues to have significant carpal tunnel symptoms.   Review of Systems  Constitutional: Negative.   HENT: Negative.   Eyes: Negative.   Respiratory: Negative.   Cardiovascular: Negative.   Endocrine: Negative.   Musculoskeletal: Negative.   Neurological: Negative.   Hematological: Negative.   Psychiatric/Behavioral: Negative.   All other  systems reviewed and are negative.    Objective: Vital Signs: There were no vitals taken for this visit.  Physical Exam  Constitutional: She is oriented to person, place, and time. She appears well-developed and well-nourished.  Pulmonary/Chest: Effort normal.  Neurological: She is alert and oriented to person, place, and time.  Skin: Skin is warm. Capillary refill takes less than 2 seconds.  Psychiatric: She has a normal mood and affect. Her behavior is normal. Judgment and thought content normal.  Nursing note and vitals reviewed.   Ortho Exam Left hand exam shows positive carpal tunnel compressive signs. Specialty Comments:  No specialty comments available.  Imaging: Xr Hand Complete Left  Result Date: 10/04/2018 Normal.    PMFS History: Patient Active Problem List   Diagnosis Date Noted  . Carpal tunnel syndrome, right upper limb 05/01/2018  . Carpal tunnel syndrome, left upper limb 05/01/2018  . Allergic rhinitis 06/30/2017   Past Medical History:  Diagnosis Date  . Allergy     Family History  Problem Relation Age of Onset  . Diabetes Father   . Diabetes Paternal Aunt   . Diabetes Paternal Uncle   . Cancer Maternal Grandmother        breast  . Breast cancer Maternal Grandmother   . Diabetes Paternal Grandfather     Past  Surgical History:  Procedure Laterality Date  . CESAREAN SECTION     02/03/2004 and 12/22/2005  . TUBAL LIGATION Bilateral 12/22/2005   Social History   Occupational History  . Not on file  Tobacco Use  . Smoking status: Former Games developer  . Smokeless tobacco: Never Used  Substance and Sexual Activity  . Alcohol use: No  . Drug use: No  . Sexual activity: Yes    Birth control/protection: Surgical

## 2018-10-11 DIAGNOSIS — G5602 Carpal tunnel syndrome, left upper limb: Secondary | ICD-10-CM | POA: Diagnosis not present

## 2018-10-25 ENCOUNTER — Ambulatory Visit (INDEPENDENT_AMBULATORY_CARE_PROVIDER_SITE_OTHER): Payer: BLUE CROSS/BLUE SHIELD | Admitting: Orthopaedic Surgery

## 2018-10-25 DIAGNOSIS — G5602 Carpal tunnel syndrome, left upper limb: Secondary | ICD-10-CM

## 2018-10-25 NOTE — Progress Notes (Signed)
   Post-Op Visit Note   Patient: Karen Johns           Date of Birth: October 28, 1976           MRN: [id number] Visit Date: 10/25/2018 PCP: [place], Betty G, MD   Assessment & Plan:  Chief Complaint:  Chief Complaint  Patient presents with  . Left Hand - Routine Post Op   Visit Diagnoses:  1. Left carpal tunnel syndrome     Plan: Karen Johns is two-week status post left carpal tunnel release.  Overall she is doing well.  The surgical incision has healed without any evidence of infection.  She is neurovascular intact.  She is able to sleep at night.  She is very happy.  Sutures were removed today and Steri-Strips were placed.  No heavy lifting until the surgical incision is completely healed.  Recheck in 4 weeks.  If doing well anticipate releasing at that time.  Follow-Up Instructions: Return in about 4 weeks (around 11/22/2018).   Orders:  No orders of the defined types were placed in this encounter.  No orders of the defined types were placed in this encounter.   Imaging: No results found.  PMFS History: Patient Active Problem List   Diagnosis Date Noted  . Carpal tunnel syndrome, right upper limb 05/01/2018  . Carpal tunnel syndrome, left upper limb 05/01/2018  . Allergic rhinitis 06/30/2017   Past Medical History:  Diagnosis Date  . Allergy     Family History  Problem Relation Age of Onset  . Diabetes Father   . Diabetes Paternal Aunt   . Diabetes Paternal Uncle   . Cancer Maternal Grandmother        breast  . Breast cancer Maternal Grandmother   . Diabetes Paternal Grandfather     Past Surgical History:  Procedure Laterality Date  . CESAREAN SECTION     02/03/2004 and 12/22/2005  . TUBAL LIGATION Bilateral 12/22/2005   Social History   Occupational History  . Not on file  Tobacco Use  . Smoking status: Former Games developer  . Smokeless tobacco: Never Used  Substance and Sexual Activity  . Alcohol use: No  . Drug use: No  . Sexual activity: Yes    Birth  control/protection: Surgical

## 2018-11-22 ENCOUNTER — Ambulatory Visit (INDEPENDENT_AMBULATORY_CARE_PROVIDER_SITE_OTHER): Payer: BLUE CROSS/BLUE SHIELD | Admitting: Orthopaedic Surgery

## 2018-11-29 ENCOUNTER — Encounter (INDEPENDENT_AMBULATORY_CARE_PROVIDER_SITE_OTHER): Payer: Self-pay | Admitting: Orthopaedic Surgery

## 2018-11-29 ENCOUNTER — Ambulatory Visit (INDEPENDENT_AMBULATORY_CARE_PROVIDER_SITE_OTHER): Payer: BLUE CROSS/BLUE SHIELD | Admitting: Orthopaedic Surgery

## 2018-11-29 DIAGNOSIS — G5602 Carpal tunnel syndrome, left upper limb: Secondary | ICD-10-CM

## 2018-11-29 DIAGNOSIS — G5601 Carpal tunnel syndrome, right upper limb: Secondary | ICD-10-CM

## 2018-11-29 NOTE — Progress Notes (Signed)
   Post-Op Visit Note   Patient: Karen Johns           Date of Birth: 09/19/1976           MRN: [id number] Visit Date: 11/29/2018 PCP: Swaziland[place], Betty G, MD   Assessment & Plan:  Chief Complaint:  Chief Complaint  Patient presents with  . Left Hand - Pain, Follow-up   Visit Diagnoses:  1. Left carpal tunnel syndrome   2. Right carpal tunnel syndrome     Plan: Karen Johns is 6 weeks status post left carpal tunnel release.  She is very happy and doing very well.  She is now able to sleep through the night.  She does have some mild pillar pain but she understands that this is normal.  Her right hand did very well and is back to normal.  At this point she will increase her activity as tolerated.  She is very happy with her progress.  I will see her back as needed.  Follow-Up Instructions: Return if symptoms worsen or fail to improve.   Orders:  No orders of the defined types were placed in this encounter.  No orders of the defined types were placed in this encounter.   Imaging: No results found.  PMFS History: Patient Active Problem List   Diagnosis Date Noted  . Carpal tunnel syndrome, right upper limb 05/01/2018  . Carpal tunnel syndrome, left upper limb 05/01/2018  . Allergic rhinitis 06/30/2017   Past Medical History:  Diagnosis Date  . Allergy     Family History  Problem Relation Age of Onset  . Diabetes Father   . Diabetes Paternal Aunt   . Diabetes Paternal Uncle   . Cancer Maternal Grandmother        breast  . Breast cancer Maternal Grandmother   . Diabetes Paternal Grandfather     Past Surgical History:  Procedure Laterality Date  . CESAREAN SECTION     02/03/2004 and 12/22/2005  . TUBAL LIGATION Bilateral 12/22/2005   Social History   Occupational History  . Not on file  Tobacco Use  . Smoking status: Former Games developermoker  . Smokeless tobacco: Never Used  Substance and Sexual Activity  . Alcohol use: No  . Drug use: No  . Sexual activity: Yes    Birth  control/protection: Surgical

## 2019-04-25 ENCOUNTER — Telehealth: Payer: Self-pay

## 2019-04-25 NOTE — Telephone Encounter (Signed)
PA for tretinoin (RETIN-A) 0.025 % cream has been sent to cover my meds.   Key: Theodoro Grist

## 2019-04-26 NOTE — Telephone Encounter (Signed)
Recived a fax today stating that the patient does not have insurance through [facility]. I have called the pharmacy and gotten the correct insurance info.  The PA has been sent  To cover my meds.  New Key: [id number] - PA Case ID: [id number]

## 2019-04-26 NOTE — Telephone Encounter (Signed)
PA has been approved through 04/25/2020. LM informing patient.

## 2019-07-21 NOTE — Progress Notes (Signed)
HPI:   [name] is a 43 y.o. female, who is here today for her routine physical.  Last CPE: 2018. Gyn routine 07/20/18.  Since her last visit she got married (11/2018) and had carpal tunnel surgery.  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 Following a healthy diet: Yes She lives with her husband. Chronic medical problems: Allergic rhinitis,vulga acne. Acne: She is on topical erythromycin and tretinoin cream. Medication is not longer helping. Facial pustular and erythematous papular lesions.  Pap smear 07/20/18 negative for malignancy and negative HPV. Hx of abnormal pap smears: Negative. Hx of STD's: Negative.  M:17 G:2 L:2 Breast fed  S/P BTL.  Immunization History  Administered Date(s) Administered  . Tdap 12/19/2012    Mammogram: 08/2018 Bi-Rads 1 Colonoscopy: N/A DEXA: N/A  Concerns today: Requesting dermatology referral. Concerned about pruritic skin rash, "bumps" on her neck, elbows, and knees. No history of eczema. She has applied OTC hydrocortisone and Benadryl spray. No history of new medication, new outdoor exposure, or insect bites. No associated fever, chills, body aches, or fatigue. No sick contacts. Probably hygiene is stable.   Review of Systems  Constitutional: Negative for appetite change and unexpected weight change.  HENT: Negative for dental problem, hearing loss, nosebleeds, sore throat and trouble swallowing.   Eyes: Negative for redness and visual disturbance.  Respiratory: Negative for cough, shortness of breath and wheezing.   Cardiovascular: Negative for chest pain and leg swelling.  Gastrointestinal: Negative for abdominal pain, blood in stool, nausea and vomiting.       No changes in bowel habits.  Endocrine: Negative for cold intolerance, heat intolerance, polydipsia, polyphagia and polyuria.  Genitourinary: Negative  for decreased urine volume, dysuria, hematuria, menstrual problem, vaginal bleeding and vaginal discharge.       No breast tenderness or nipple discharge.  Musculoskeletal: Negative for arthralgias and back pain.  Skin: Positive for rash. Negative for wound.  Allergic/Immunologic: Positive for environmental allergies.  Neurological: Negative for syncope, weakness, numbness and headaches.  Hematological: Negative for adenopathy. Does not bruise/bleed easily.  Psychiatric/Behavioral: Negative for confusion and sleep disturbance. The patient is not nervous/anxious.   All other systems reviewed and are negative.   Current Outpatient Medications on File Prior to Visit  Medication Sig Dispense Refill  . erythromycin with ethanol (THERAMYCIN) 2 % external solution Apply topically daily. 60 mL 6  . Multiple Vitamins-Minerals (MULTIVITAMIN ADULT PO) Take by mouth daily.    Marland Kitchen tretinoin (RETIN-A) 0.025 % cream Apply topically at bedtime. 45 g 3   No current facility-administered medications on file prior to visit.      Past Medical History:  Diagnosis Date  . Allergy     Past Surgical History:  Procedure Laterality Date  . CESAREAN SECTION     02/03/2004 and 12/22/2005  . TUBAL LIGATION Bilateral 12/22/2005    Allergies  Allergen Reactions  . Shellfish Allergy     Family History  Problem Relation Age of Onset  . Diabetes Father   . Diabetes Paternal Aunt   . Diabetes Paternal Uncle   . Cancer Maternal Grandmother        breast  . Breast cancer Maternal Grandmother   . Diabetes Paternal Grandfather     Social History   Socioeconomic History  . Marital status: Married    Spouse name: Not on file  . Number of children: Not on file  .  Years of education: Not on file  . Highest education level: Not on file  Occupational History  . Not on file  Social Needs  . Financial resource strain: Not on file  . Food insecurity    Worry: Not on file    Inability: Not on file  .  Transportation needs    Medical: Not on file    Non-medical: Not on file  Tobacco Use  . Smoking status: Former Games developermoker  . Smokeless tobacco: Never Used  Substance and Sexual Activity  . Alcohol use: No  . Drug use: No  . Sexual activity: Yes    Birth control/protection: Surgical  Lifestyle  . Physical activity    Days per week: Not on file    Minutes per session: Not on file  . Stress: Not on file  Relationships  . Social Musicianconnections    Talks on phone: Not on file    Gets together: Not on file    Attends religious service: Not on file    Active member of club or organization: Not on file    Attends meetings of clubs or organizations: Not on file    Relationship status: Not on file  Other Topics Concern  . Not on file  Social History Narrative  . Not on file   Vitals:   07/22/19 0748  BP: 130/80  Pulse: 61  Resp: 12  Temp: 98.2 F (36.8 C)  SpO2: 99%   Body mass index is 24.12 kg/m.   Wt Readings from Last 3 Encounters:  07/22/19 134 lb (60.8 kg)  09/28/18 148 lb 4 oz (67.2 kg)  07/20/18 139 lb 2 oz (63.1 kg)   Physical Exam  Nursing note and vitals reviewed. Constitutional: She is oriented to person, place, and time. She appears well-developed and well-nourished. No distress.  HENT:  Head: Normocephalic and atraumatic.  Right Ear: Hearing, tympanic membrane, external ear and ear canal normal.  Left Ear: Hearing, tympanic membrane, external ear and ear canal normal.  Mouth/Throat: Uvula is midline, oropharynx is clear and moist and mucous membranes are normal.  Eyes: Pupils are equal, round, and reactive to light. Conjunctivae and EOM are normal.  Neck: No tracheal deviation present. No thyromegaly present.  Cardiovascular: Normal rate and regular rhythm.  No murmur heard. Pulses:      Dorsalis pedis pulses are 2+ on the right side and 2+ on the left side.  Respiratory: Effort normal and breath sounds normal. No respiratory distress. No breast swelling or  tenderness.  GI: Soft. She exhibits no mass. There is no hepatomegaly. There is no abdominal tenderness.  Genitourinary:    Genitourinary Comments: Breast: No masses, skin abnormalities, or nipple discharge appreciated bilateral.   Musculoskeletal:        General: No edema.     Comments: No major deformity or signs of synovitis appreciated.  Lymphadenopathy:    She has no cervical adenopathy.    She has no axillary adenopathy.       Right: No supraclavicular adenopathy present.       Left: No supraclavicular adenopathy present.  Neurological: She is alert and oriented to person, place, and time. She has normal strength. No cranial nerve deficit. Coordination and gait normal.  Reflex Scores:      Bicep reflexes are 2+ on the right side and 2+ on the left side.      Patellar reflexes are 2+ on the right side and 2+ on the left side. Skin: Skin is warm. No  erythema.  Micropapular nonerythematous lesions on elbows. There is no tenderness or local edema.  Post inflammatory hyperpigmentation changes on cheeks.  Scattered erythematous papular lesions on face.  Psychiatric: She has a normal mood and affect. Cognition and memory are normal.  Well groomed, good eye contact.    ASSESSMENT AND PLAN:  Ms. Karen Johns was here today annual physical examination.  Orders Placed This Encounter  Procedures  . Basic metabolic panel  . Lipid panel  . Microalbumin / creatinine urine ratio  . Basic metabolic panel  . CBC  . VITAMIN D 25 Hydroxy (Vit-D Deficiency, Fractures)  . Ambulatory referral to Dermatology   Lab Results  Component Value Date   CREATININE 1.41 (H) 07/22/2019   BUN 26 (H) 07/22/2019   NA 137 07/22/2019   K 4.3 07/22/2019   CL 102 07/22/2019   CO2 25 07/22/2019   Lab Results  Component Value Date   CHOL 128 07/22/2019   HDL 43.40 07/22/2019   LDLCALC 69 07/22/2019   TRIG 78.0 07/22/2019   CHOLHDL 3 07/22/2019     Routine general medical examination at a health  care facility We discussed the importance of regular physical activity and healthy diet for prevention of chronic illness and/or complications. Preventive guidelines reviewed. Vaccination up-to-date, recommend influenza vaccine in September or October this year.  Next CPE in a year.  Pruritic rash ?  Seborrheic dermatitis. Recommend topical triamcinolone, small amount twice daily for up to 14 days. Some side effects of topical steroid discussed. Dermatology referral placed. Instructed about warning signs.  -     Ambulatory referral to Dermatology -     triamcinolone cream (KENALOG) 0.1 %; Apply 1 application topically 2 (two) times daily.  Acne Current medications are no longer helping. Recommend continue to follow with dermatologist.  Abnormal renal function test In the past problem has been associated with dehydration and/or protein supplements. We will recommend repeating lab in 3 to 4 months.   Diabetes mellitus screening -     Basic metabolic panel  Screening for lipid disorders -     Lipid panel   Return in 1 year (on 07/21/2020) for cpe.     G. SwazilandJordan, MD  [facility]. [place] office.

## 2019-07-22 ENCOUNTER — Encounter: Payer: Self-pay | Admitting: Family Medicine

## 2019-07-22 ENCOUNTER — Ambulatory Visit (INDEPENDENT_AMBULATORY_CARE_PROVIDER_SITE_OTHER): Payer: BC Managed Care – PPO | Admitting: Family Medicine

## 2019-07-22 ENCOUNTER — Other Ambulatory Visit: Payer: Self-pay

## 2019-07-22 VITALS — BP 130/80 | HR 61 | Temp 98.2°F | Resp 12 | Ht 62.5 in | Wt 134.0 lb

## 2019-07-22 DIAGNOSIS — L282 Other prurigo: Secondary | ICD-10-CM

## 2019-07-22 DIAGNOSIS — Z0000 Encounter for general adult medical examination without abnormal findings: Secondary | ICD-10-CM

## 2019-07-22 DIAGNOSIS — L709 Acne, unspecified: Secondary | ICD-10-CM | POA: Diagnosis not present

## 2019-07-22 DIAGNOSIS — Z13220 Encounter for screening for lipoid disorders: Secondary | ICD-10-CM

## 2019-07-22 DIAGNOSIS — R944 Abnormal results of kidney function studies: Secondary | ICD-10-CM | POA: Insufficient documentation

## 2019-07-22 DIAGNOSIS — Z131 Encounter for screening for diabetes mellitus: Secondary | ICD-10-CM

## 2019-07-22 LAB — BASIC METABOLIC PANEL
BUN: 26 mg/dL — ABNORMAL HIGH (ref 6–23)
CO2: 25 mEq/L (ref 19–32)
Calcium: 9.7 mg/dL (ref 8.4–10.5)
Chloride: 102 mEq/L (ref 96–112)
Creatinine, Ser: 1.41 mg/dL — ABNORMAL HIGH (ref 0.40–1.20)
GFR: 41.08 mL/min — ABNORMAL LOW (ref >60.00)
Glucose, Bld: 90 mg/dL (ref 70–99)
Potassium: 4.3 mEq/L (ref 3.5–5.1)
Sodium: 137 mEq/L (ref 135–145)

## 2019-07-22 LAB — LIPID PANEL
Cholesterol: 128 mg/dL (ref 0–200)
HDL: 43.4 mg/dL (ref >39.00)
LDL Cholesterol: 69 mg/dL (ref 0–99)
NonHDL: 85.04
Total CHOL/HDL Ratio: 3
Triglycerides: 78 mg/dL (ref 0.0–149.0)
VLDL: 15.6 mg/dL (ref 0.0–40.0)

## 2019-07-22 MED ORDER — TRIAMCINOLONE ACETONIDE 0.1 % EX CREA: 1.0000 "application " | TOPICAL_CREAM | Freq: Two times a day (BID) | CUTANEOUS | 1 refills | Status: DC

## 2019-07-22 NOTE — Assessment & Plan Note (Signed)
In the past problem has been associated with dehydration and/or protein supplements. We will recommend repeating lab in 3 to 4 months.

## 2019-07-22 NOTE — Assessment & Plan Note (Signed)
Current medications are no longer helping. Recommend continue to follow with dermatologist.

## 2019-07-22 NOTE — Patient Instructions (Addendum)
A few things to remember from today's visit:   Routine general medical examination at a health care facility  Pruritic rash - Plan: Ambulatory referral to Dermatology  Diabetes mellitus screening - Plan: Basic metabolic panel  Screening for lipid disorders - Plan: Lipid panel    Today you have you routine preventive visit.  At least 150 minutes of moderate exercise per week, daily brisk walking for 15-30 min is a good exercise option. Healthy diet low in saturated (animal) fats and sweets and consisting of fresh fruits and vegetables, lean meats such as fish and white chicken and whole grains.  These are some of recommendations for screening depending of age and risk factors:   - Vaccines:  Tdap vaccine every 10 years.  Shingles vaccine recommended at age 27, could be given after 41 years of age but not sure about insurance coverage.   Pneumonia vaccines:  Prevnar 13 at 65 and Pneumovax at 60. Sometimes Pneumovax is giving earlier if history of smoking, lung disease,diabetes,kidney disease among some.    Screening for diabetes at age 59 and every 3 years.  Cervical cancer prevention:  Due in 2024.  -Breast cancer: Mammogram: There is disagreement between experts about when to start screening in low risk asymptomatic female but recent recommendations are to start screening at 22 and not later than 41 years old , every 1-2 years and after 41 yo q 2 years. Screening is recommended until 41 years old but some women can continue screening depending of healthy issues.   Colon cancer screening: starts at 41 years old until 41 years old.  Cholesterol disorder screening at age 75 and every 3 years.  Also recommended:  1. Dental visit- Brush and floss your teeth twice daily; visit your dentist twice a year. 2. Eye doctor- Get an eye exam at least every 2 years. 3. Helmet use- Always wear a helmet when riding a bicycle, motorcycle, rollerblading or skateboarding. 4. Safe sex- If  you may be exposed to sexually transmitted infections, use a condom. 5. Seat belts- Seat belts can save your live; always wear one. 6. Smoke/Carbon Monoxide detectors- These detectors need to be installed on the appropriate level of your home. Replace batteries at least once a year. 7. Skin cancer- When out in the sun please cover up and use sunscreen 15 SPF or higher. 8. Violence- If anyone is threatening or hurting you, please tell your healthcare provider.  9. Drink alcohol in moderation- Limit alcohol intake to one drink or less per day. Never drink and drive.

## 2019-07-25 NOTE — Telephone Encounter (Signed)
Left message for patient to return call to office for lab results. P[facility] nurse may give patient results if she call back.

## 2019-07-25 NOTE — Telephone Encounter (Signed)
Copied from [place] [phone]. Topic: General - Call Back - No Documentation >> Jul 23, 2019  5:16 PM Mcneil, Ja-Kwan wrote: Reason for CRM: Pt stated she had a message from the office asking her to call back. Pt requests call back. >> Jul 24, 2019  8:46 AM Cox, Melburn Hake, CMA wrote: Returning call for lab results. No CRM created.

## 2019-10-24 ENCOUNTER — Other Ambulatory Visit: Payer: Self-pay | Admitting: Family Medicine

## 2019-10-24 DIAGNOSIS — Z1231 Encounter for screening mammogram for malignant neoplasm of breast: Secondary | ICD-10-CM

## 2019-11-03 DIAGNOSIS — Z20828 Contact with and (suspected) exposure to other viral communicable diseases: Secondary | ICD-10-CM | POA: Diagnosis not present

## 2019-12-11 ENCOUNTER — Ambulatory Visit
Admission: RE | Admit: 2019-12-11 | Discharge: 2019-12-11 | Disposition: A | Discharge status: Home / Self Care | Payer: Self-pay | Source: Ambulatory Visit | Attending: Family Medicine | Admitting: Family Medicine

## 2019-12-11 ENCOUNTER — Other Ambulatory Visit: Payer: Self-pay

## 2019-12-11 DIAGNOSIS — Z1231 Encounter for screening mammogram for malignant neoplasm of breast: Secondary | ICD-10-CM | POA: Diagnosis not present

## 2019-12-27 DIAGNOSIS — Z20822 Contact with and (suspected) exposure to covid-19: Secondary | ICD-10-CM | POA: Diagnosis not present

## 2020-01-16 DIAGNOSIS — Z03818 Encounter for observation for suspected exposure to other biological agents ruled out: Secondary | ICD-10-CM | POA: Diagnosis not present

## 2020-09-29 ENCOUNTER — Other Ambulatory Visit: Payer: Self-pay

## 2020-09-29 ENCOUNTER — Encounter: Payer: Self-pay | Admitting: Family Medicine

## 2020-09-29 ENCOUNTER — Ambulatory Visit: Payer: BC Managed Care – PPO | Admitting: Family Medicine

## 2020-09-29 VITALS — BP 110/70 | HR 92 | Temp 98.5°F | Ht 62.5 in | Wt 144.5 lb

## 2020-09-29 DIAGNOSIS — Z0000 Encounter for general adult medical examination without abnormal findings: Secondary | ICD-10-CM | POA: Diagnosis not present

## 2020-09-29 DIAGNOSIS — Z13228 Encounter for screening for other metabolic disorders: Secondary | ICD-10-CM

## 2020-09-29 DIAGNOSIS — Z13220 Encounter for screening for lipoid disorders: Secondary | ICD-10-CM | POA: Diagnosis not present

## 2020-09-29 DIAGNOSIS — Z1329 Encounter for screening for other suspected endocrine disorder: Secondary | ICD-10-CM | POA: Diagnosis not present

## 2020-09-29 DIAGNOSIS — L709 Acne, unspecified: Secondary | ICD-10-CM | POA: Diagnosis not present

## 2020-09-29 DIAGNOSIS — Z130 Encounter for screening for diseases of the blood and blood-forming organs and certain disorders involving the immune mechanism: Secondary | ICD-10-CM

## 2020-09-29 DIAGNOSIS — Z1159 Encounter for screening for other viral diseases: Secondary | ICD-10-CM

## 2020-09-29 MED ORDER — TRETINOIN 0.025 % EX CREA: TOPICAL_CREAM | Freq: Every day | CUTANEOUS | 3 refills | Status: DC

## 2020-09-29 NOTE — Progress Notes (Signed)
HPI: [name] is a pleasant 44 y.o. female, who is here today for her routine physical.  Last CPE: 07/22/19.  Regular exercise 3 or more time per week: She is a Systems analyst, cardio and wt lifting daily. Following a healthy diet: Yes, rarely eats out.  She lives with her husband  Chronic medical problems:Otherwise healthy except for allergic rhinitis and acne. She applied tretinoin cream on face, helps with acne.  Pap smear:07/2018 negative malignancy and HPV not detected. Hx of abnormal pap smears: Negative.  Immunization History  Administered Date(s) Administered  . Influenza-Unspecified 09/30/2017, 07/26/2018, 07/23/2019  . Tdap 12/19/2012, 08/15/2013   Mammogram: 12/2013 Colonoscopy: 12/20/19. DEXA: N/A  Hep C screening: Never  She has no concerns today. She needs biometric form for insurance services.  Review of Systems  Constitutional: Negative for appetite change, fatigue and fever.  HENT: Negative for dental problem, hearing loss, mouth sores, sore throat, trouble swallowing and voice change.   Eyes: Negative for redness and visual disturbance.  Respiratory: Negative for cough, shortness of breath and wheezing.   Cardiovascular: Negative for chest pain, palpitations and leg swelling.  Gastrointestinal: Negative for abdominal pain, nausea and vomiting.       No changes in bowel habits.  Endocrine: Negative for cold intolerance, heat intolerance, polydipsia, polyphagia and polyuria.  Genitourinary: Negative for decreased urine volume, dysuria, hematuria, vaginal bleeding and vaginal discharge.  Musculoskeletal: Negative for arthralgias, gait problem and myalgias.  Skin: Negative for color change and rash.  Allergic/Immunologic: Positive for environmental allergies.  Neurological: Negative for syncope, weakness and headaches.  Hematological: Negative for adenopathy. Does not bruise/bleed easily.  Psychiatric/Behavioral: Negative for confusion and sleep  disturbance. The patient is not nervous/anxious.   All other systems reviewed and are negative.  Current Outpatient Medications on File Prior to Visit  Medication Sig Dispense Refill  . Multiple Vitamins-Minerals (MULTIVITAMIN ADULT PO) Take by mouth daily.     No current facility-administered medications on file prior to visit.   Past Medical History:  Diagnosis Date  . Allergy    Past Surgical History:  Procedure Laterality Date  . CESAREAN SECTION     02/03/2004 and 12/22/2005  . TUBAL LIGATION Bilateral 12/22/2005    Allergies  Allergen Reactions  . Shellfish Allergy     Family History  Problem Relation Age of Onset  . Diabetes Father   . Diabetes Paternal Aunt   . Diabetes Paternal Uncle   . Cancer Maternal Grandmother        breast  . Breast cancer Maternal Grandmother   . Diabetes Paternal Grandfather     Social History   Socioeconomic History  . Marital status: Married    Spouse name: Not on file  . Number of children: Not on file  . Years of education: Not on file  . Highest education level: Not on file  Occupational History  . Not on file  Tobacco Use  . Smoking status: Former Games developer  . Smokeless tobacco: Never Used  Substance and Sexual Activity  . Alcohol use: No  . Drug use: No  . Sexual activity: Yes    Birth control/protection: Surgical  Other Topics Concern  . Not on file  Social History Narrative  . Not on file   Social Determinants of Health   Financial Resource Strain:   . Difficulty of Paying Living Expenses: Not on file  Food Insecurity:   . Worried About Programme researcher, broadcasting/film/video in the Last Year: Not on file  .  Ran Out of Food in the Last Year: Not on file  Transportation Needs:   . Lack of Transportation (Medical): Not on file  . Lack of Transportation (Non-Medical): Not on file  Physical Activity:   . Days of Exercise per Week: Not on file  . Minutes of Exercise per Session: Not on file  Stress:   . Feeling of Stress : Not on  file  Social Connections:   . Frequency of Communication with Friends and Family: Not on file  . Frequency of Social Gatherings with Friends and Family: Not on file  . Attends Religious Services: Not on file  . Active Member of Clubs or Organizations: Not on file  . Attends Banker Meetings: Not on file  . Marital Status: Not on file     Vitals:   09/29/20 0927  BP: 110/70  Pulse: 92  Temp: 98.5 F (36.9 C)  SpO2: 98%   Body mass index is 26.01 kg/m.   Wt Readings from Last 3 Encounters:  09/29/20 144 lb 8 oz (65.5 kg)  07/22/19 134 lb (60.8 kg)  09/28/18 148 lb 4 oz (67.2 kg)     Physical Exam Vitals and nursing note reviewed.  Constitutional:      General: She is not in acute distress.    Appearance: She is well-developed and normal weight.  HENT:     Head: Normocephalic and atraumatic.     Right Ear: Hearing, tympanic membrane, ear canal and external ear normal.     Left Ear: Hearing, tympanic membrane, ear canal and external ear normal.     Mouth/Throat:     Lips: No lesions.     Mouth: Mucous membranes are moist.     Pharynx: Oropharynx is clear. Uvula midline.  Eyes:     Extraocular Movements: Extraocular movements intact.     Conjunctiva/sclera: Conjunctivae normal.     Pupils: Pupils are equal, round, and reactive to light.  Neck:     Thyroid: No thyromegaly.     Trachea: No tracheal deviation.  Cardiovascular:     Rate and Rhythm: Normal rate and regular rhythm.     Pulses:          Dorsalis pedis pulses are 2+ on the right side and 2+ on the left side.     Heart sounds: No murmur heard.   Pulmonary:     Effort: Pulmonary effort is normal. No respiratory distress.     Breath sounds: Normal breath sounds.  Abdominal:     Palpations: Abdomen is soft. There is no hepatomegaly or mass.     Tenderness: There is no abdominal tenderness.  Genitourinary:    Comments: Breast: No masses,skin changes,or nipple discharge  bilateral. Musculoskeletal:     Comments: No major deformity or signs of synovitis appreciated.  Lymphadenopathy:     Cervical: No cervical adenopathy.     Upper Body:     Right upper body: No supraclavicular or axillary adenopathy.     Left upper body: No supraclavicular or axillary adenopathy.  Skin:    General: Skin is warm.     Findings: No erythema. Rash is not pustular.     Comments: A few white comedones on upper back.  Neurological:     General: No focal deficit present.     Mental Status: She is alert and oriented to person, place, and time.     Cranial Nerves: Cranial nerves are intact.     Coordination: Coordination normal.  Gait: Gait normal.     Deep Tendon Reflexes:     Reflex Scores:      Bicep reflexes are 2+ on the right side and 2+ on the left side.      Patellar reflexes are 2+ on the right side and 2+ on the left side. Psychiatric:        Mood and Affect: Mood and affect normal.     Comments: Well groomed, good eye contact.   ASSESSMENT AND PLAN:  Ms. Karen Johns was here today annual physical examination.  Orders Placed This Encounter  Procedures  . BASIC METABOLIC PANEL WITH GFR  . Hepatitis C antibody  . Lipid panel   Lab Results  Component Value Date   CHOL 173 09/29/2020   HDL 56 09/29/2020   LDLCALC 93 09/29/2020   TRIG 142 09/29/2020   CHOLHDL 3.1 09/29/2020   Lab Results  Component Value Date   CREATININE 1.07 09/29/2020   BUN 18 09/29/2020   NA 138 09/29/2020   K 4.8 09/29/2020   CL 101 09/29/2020   CO2 26 09/29/2020   Routine general medical examination at a health care facility Continue regular physical activity and healthy diet for prevention of chronic illness. Preventive guidelines reviewed. Pap smear is due in 2023. Vaccination up to date. Next CPE in a year.  Biometric form signed. She can print labs and attach them to form.  The 10-year ASCVD risk score Denman George(Goff [place] Montez HagemanJr., et al., 2013) is: 0.4%   Values used to  calculate the score:     Age: [age] years     Sex: Female     Is Non-Hispanic African American: No     Diabetic: No     Tobacco smoker: No     Systolic Blood Pressure: 110 mmHg     Is BP treated: No     HDL Cholesterol: 56 mg/dL     Total Cholesterol: 173 mg/dL  Acne, unspecified acne type Well controlled. No changes in current management.  -     tretinoin (RETIN-A) 0.025 % cream; Apply topically at bedtime.  Screening for lipoid disorders -     Lipid panel  Screening for endocrine, metabolic and immunity disorder -     BASIC METABOLIC PANEL WITH GFR  Encounter for HCV screening test for low risk patient -     Hepatitis C antibody   Return in 1 year (on 09/29/2021) for CPE.  Aaima Gaddie G. [place], MD  [facility]. [place] office.   Today you have you routine preventive visit. A few things to remember from today's visit:   Routine general medical examination at a health care facility  Acne, unspecified acne type - Plan: tretinoin (RETIN-A) 0.025 % cream  Screening for lipoid disorders - Plan: Lipid panel  Screening for endocrine, metabolic and immunity disorder - Plan: BASIC METABOLIC PANEL WITH GFR  Encounter for HCV screening test for low risk patient - Plan: Hepatitis C antibody  If you need refills please call your pharmacy. Do not use My Chart to request refills or for acute issues that need immediate attention.   Please be sure medication list is accurate. If a new problem present, please set up appointment sooner than planned today.  At least 150 minutes of moderate exercise per week, daily brisk walking for 15-30 min is a good exercise option. Healthy diet low in saturated (animal) fats and sweets and consisting of fresh fruits and vegetables, lean meats such as fish and white chicken  and whole grains.  These are some of recommendations for screening depending of age and risk factors:  - Vaccines:  Tdap vaccine every 10 years.  Shingles vaccine  recommended at age 97, could be given after 42 years of age but not sure about insurance coverage.   Pneumonia vaccines: Pneumovax at 65. Sometimes Pneumovax is giving earlier if history of smoking, lung disease,diabetes,kidney disease among some.  Screening for diabetes at age 31 and every 3 years.  Cervical cancer prevention:  Pap smear starts at 42 years of age and continues periodically until 42 years old in low risk women. Pap smear every 3 years between 67 and 95 years old. Pap smear every 3-5 years between women 30 and older if pap smear negative and HPV screening negative.   -Breast cancer: Mammogram: There is disagreement between experts about when to start screening in low risk asymptomatic female but recent recommendations are to start screening at 72 and not later than 42 years old , every 1-2 years and after 42 yo q 2 years. Screening is recommended until 42 years old but some women can continue screening depending of healthy issues.  Colon cancer screening: Has been recently changed to 42 yo. Insurance may not cover until you are 42 years old. Screening is recommended until 42 years old.  Cholesterol disorder screening at age 25 and every 3 years.  Also recommended:  1. Dental visit- Brush and floss your teeth twice daily; visit your dentist twice a year. 2. Eye doctor- Get an eye exam at least every 2 years. 3. Helmet use- Always wear a helmet when riding a bicycle, motorcycle, rollerblading or skateboarding. 4. Safe sex- If you may be exposed to sexually transmitted infections, use a condom. 5. Seat belts- Seat belts can save your live; always wear one. 6. Smoke/Carbon Monoxide detectors- These detectors need to be installed on the appropriate level of your home. Replace batteries at least once a year. 7. Skin cancer- When out in the sun please cover up and use sunscreen 15 SPF or higher. 8. Violence- If anyone is threatening or hurting you, please tell your healthcare  provider.  9. Drink alcohol in moderation- Limit alcohol intake to one drink or less per day. Never drink and drive. 10. Calcium supplementation 1000 to 1200 mg daily, ideally through your diet.  Vitamin D supplementation 800 units daily.

## 2020-09-29 NOTE — Patient Instructions (Signed)
Today you have you routine preventive visit. A few things to remember from today's visit:   Routine general medical examination at a health care facility  Acne, unspecified acne type - Plan: tretinoin (RETIN-A) 0.025 % cream  Screening for lipoid disorders - Plan: Lipid panel  Screening for endocrine, metabolic and immunity disorder - Plan: BASIC METABOLIC PANEL WITH GFR  Encounter for HCV screening test for low risk patient - Plan: Hepatitis C antibody  If you need refills please call your pharmacy. Do not use My Chart to request refills or for acute issues that need immediate attention.   Please be sure medication list is accurate. If a new problem present, please set up appointment sooner than planned today.  At least 150 minutes of moderate exercise per week, daily brisk walking for 15-30 min is a good exercise option. Healthy diet low in saturated (animal) fats and sweets and consisting of fresh fruits and vegetables, lean meats such as fish and white chicken and whole grains.  These are some of recommendations for screening depending of age and risk factors:  - Vaccines:  Tdap vaccine every 10 years.  Shingles vaccine recommended at age 60, could be given after 42 years of age but not sure about insurance coverage.   Pneumonia vaccines: Pneumovax at 65. Sometimes Pneumovax is giving earlier if history of smoking, lung disease,diabetes,kidney disease among some.  Screening for diabetes at age 81 and every 3 years.  Cervical cancer prevention:  Pap smear starts at 42 years of age and continues periodically until 42 years old in low risk women. Pap smear every 3 years between 45 and 17 years old. Pap smear every 3-5 years between women 30 and older if pap smear negative and HPV screening negative.   -Breast cancer: Mammogram: There is disagreement between experts about when to start screening in low risk asymptomatic female but recent recommendations are to start screening  at 53 and not later than 42 years old , every 1-2 years and after 42 yo q 2 years. Screening is recommended until 42 years old but some women can continue screening depending of healthy issues.  Colon cancer screening: Has been recently changed to 42 yo. Insurance may not cover until you are 42 years old. Screening is recommended until 42 years old.  Cholesterol disorder screening at age 49 and every 3 years.  Also recommended:  1. Dental visit- Brush and floss your teeth twice daily; visit your dentist twice a year. 2. Eye doctor- Get an eye exam at least every 2 years. 3. Helmet use- Always wear a helmet when riding a bicycle, motorcycle, rollerblading or skateboarding. 4. Safe sex- If you may be exposed to sexually transmitted infections, use a condom. 5. Seat belts- Seat belts can save your live; always wear one. 6. Smoke/Carbon Monoxide detectors- These detectors need to be installed on the appropriate level of your home. Replace batteries at least once a year. 7. Skin cancer- When out in the sun please cover up and use sunscreen 15 SPF or higher. 8. Violence- If anyone is threatening or hurting you, please tell your healthcare provider.  9. Drink alcohol in moderation- Limit alcohol intake to one drink or less per day. Never drink and drive. 10. Calcium supplementation 1000 to 1200 mg daily, ideally through your diet.  Vitamin D supplementation 800 units daily.

## 2020-09-30 LAB — BASIC METABOLIC PANEL WITH GFR
BUN: 18 mg/dL (ref 7–25)
CO2: 26 mmol/L (ref 20–32)
Calcium: 10.2 mg/dL (ref 8.6–10.2)
Chloride: 101 mmol/L (ref 98–110)
Creat: 1.07 mg/dL (ref 0.50–1.10)
GFR, Est African American: 74 mL/min/{1.73_m2} (ref >=60)
GFR, Est Non African American: 64 mL/min/{1.73_m2} (ref >=60)
Glucose, Bld: 90 mg/dL (ref 65–99)
Potassium: 4.8 mmol/L (ref 3.5–5.3)
Sodium: 138 mmol/L (ref 135–146)

## 2020-09-30 LAB — LIPID PANEL
Cholesterol: 173 mg/dL (ref <200)
HDL: 56 mg/dL (ref >=50)
LDL Cholesterol (Calc): 93 mg/dL (calc)
Non-HDL Cholesterol (Calc): 117 mg/dL (calc) (ref <130)
Total CHOL/HDL Ratio: 3.1 (calc) (ref <5.0)
Triglycerides: 142 mg/dL (ref <150)

## 2020-09-30 LAB — HEPATITIS C ANTIBODY
Hepatitis C Ab: NONREACTIVE
SIGNAL TO CUT-OFF: 0.01 (ref <1.00)

## 2020-11-26 DIAGNOSIS — Z1231 Encounter for screening mammogram for malignant neoplasm of breast: Secondary | ICD-10-CM

## 2020-12-04 DIAGNOSIS — Z130 Encounter for screening for diseases of the blood and blood-forming organs and certain disorders involving the immune mechanism: Secondary | ICD-10-CM | POA: Diagnosis not present

## 2020-12-29 ENCOUNTER — Other Ambulatory Visit: Payer: Self-pay | Admitting: Family Medicine

## 2020-12-29 DIAGNOSIS — Z1231 Encounter for screening mammogram for malignant neoplasm of breast: Secondary | ICD-10-CM

## 2020-12-31 ENCOUNTER — Ambulatory Visit
Admission: RE | Admit: 2020-12-31 | Discharge: 2020-12-31 | Disposition: A | Discharge status: Home / Self Care | Payer: BC Managed Care – PPO | Source: Ambulatory Visit

## 2020-12-31 ENCOUNTER — Other Ambulatory Visit: Payer: Self-pay

## 2020-12-31 DIAGNOSIS — Z1231 Encounter for screening mammogram for malignant neoplasm of breast: Secondary | ICD-10-CM

## 2021-01-01 ENCOUNTER — Other Ambulatory Visit: Payer: Self-pay | Admitting: Family Medicine

## 2021-01-01 DIAGNOSIS — R928 Other abnormal and inconclusive findings on diagnostic imaging of breast: Secondary | ICD-10-CM

## 2021-01-20 ENCOUNTER — Other Ambulatory Visit: Payer: Self-pay | Admitting: Family Medicine

## 2021-01-20 ENCOUNTER — Ambulatory Visit
Admission: RE | Admit: 2021-01-20 | Discharge: 2021-01-20 | Disposition: A | Discharge status: Home / Self Care | Payer: BC Managed Care – PPO | Source: Ambulatory Visit | Attending: Family Medicine | Admitting: Family Medicine

## 2021-01-20 ENCOUNTER — Other Ambulatory Visit: Payer: Self-pay

## 2021-01-20 DIAGNOSIS — R928 Other abnormal and inconclusive findings on diagnostic imaging of breast: Secondary | ICD-10-CM

## 2021-01-20 DIAGNOSIS — N6489 Other specified disorders of breast: Secondary | ICD-10-CM | POA: Diagnosis not present

## 2021-03-26 ENCOUNTER — Other Ambulatory Visit: Payer: Self-pay

## 2021-03-26 ENCOUNTER — Ambulatory Visit: Payer: BC Managed Care – PPO | Admitting: Family Medicine

## 2021-03-26 ENCOUNTER — Encounter: Payer: Self-pay | Admitting: Family Medicine

## 2021-03-26 VITALS — BP 120/70 | HR 74 | Resp 12 | Ht 62.5 in | Wt 141.0 lb

## 2021-03-26 DIAGNOSIS — Z310 Encounter for reversal of previous sterilization: Secondary | ICD-10-CM | POA: Diagnosis not present

## 2021-03-26 DIAGNOSIS — L309 Dermatitis, unspecified: Secondary | ICD-10-CM | POA: Diagnosis not present

## 2021-03-26 MED ORDER — KETOCONAZOLE 2 % EX CREA: 1.0000 "application " | TOPICAL_CREAM | Freq: Every day | CUTANEOUS | 0 refills | Status: AC

## 2021-03-26 MED ORDER — DESONIDE 0.05 % EX CREA: TOPICAL_CREAM | Freq: Two times a day (BID) | CUTANEOUS | 0 refills | Status: AC

## 2021-03-26 NOTE — Patient Instructions (Signed)
A few things to remember from today's visit:   Dermatitis  If you need refills please call your pharmacy. Do not use My Chart to request refills or for acute issues that need immediate attention.   Desonide cream a very small amount around eyes. Baby shampoo to clean eye lids. Zyrtec 10 mg daily. If not resolved in 2 weeks, derma evaluation may be necessary.  Please be sure medication list is accurate. If a new problem present, please set up appointment sooner than planned today.

## 2021-03-26 NOTE — Progress Notes (Signed)
Chief Complaint  Patient presents with  . Allergies  . tubal reversal   HPI: Karen Johns is a 45 y.o. female, who is here today complaining of periocular, erythematous ,and pruritic rash. This is a new problem. Noted about 2 weeks ago. Dry "flaky" skin.  Negative for new medication, detergent, soap, make-up or body product. No known insect bite or outdoor exposures to plants. No sick contact.  OTC medication for this problem: Moisturizer. Problem is stable.  In the past she has had pruritic skin rash on extremities, attributed to food allergies, resolved after she stopped nuts intake.  Negative for headache,conjunctial erythema,eye discharge,visual changes,oral lesions/edema,cough, wheezing, dyspnea, abdominal pain, nausea, or vomiting. No associated nasal congestion,rhinorrhea,or sore throat.  Recently she underwent tubuligation reversal, right tubotubal anastomosis and left ampullary salpingostomy on 02/02/21. 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  Review of Systems  Constitutional: Negative for appetite change, chills, fatigue and fever.  HENT: Negative for congestion and mouth sores.   Eyes: Negative for discharge and redness.  Respiratory: Negative for stridor.   Cardiovascular: Negative for chest pain, palpitations and leg swelling.  Musculoskeletal: Negative for arthralgias and myalgias.  Allergic/Immunologic: Positive for environmental allergies.  Neurological: Negative for syncope, facial asymmetry and weakness.  Hematological: Negative for adenopathy. Does not bruise/bleed easily.  Rest see pertinent positives and negatives per HPI.  Current Outpatient Medications on File Prior to Visit  Medication Sig Dispense Refill  . Multiple Vitamins-Minerals (MULTIVITAMIN ADULT PO) Take by mouth daily.    Marland Kitchen tretinoin  (RETIN-A) 0.025 % cream Apply topically at bedtime. 45 g 3   No current facility-administered medications on file prior to visit.     Past Medical History:  Diagnosis Date  . Allergy    Allergies  Allergen Reactions  . Shellfish Allergy     Social History   Socioeconomic History  . Marital status: Married    Spouse name: Not on file  . Number of children: Not on file  . Years of education: Not on file  . Highest education level: Not on file  Occupational History  . Not on file  Tobacco Use  . Smoking status: Former Games developer  . Smokeless tobacco: Never Used  Substance and Sexual Activity  . Alcohol use: No  . Drug use: No  . Sexual activity: Yes    Birth control/protection: Surgical  Other Topics Concern  . Not on file  Social History Narrative  . Not on file   Social Determinants of Health   Financial Resource Strain: Not on file  Food Insecurity: Not on file  Transportation Needs: Not on file  Physical Activity: Not on file  Stress: Not on file  Social Connections: Not on file   Vitals:   03/26/21 1046  BP: 120/70  Pulse: 74  Resp: 12  SpO2: 93%   Body mass index is 25.38 kg/m.  Physical Exam Vitals and nursing note reviewed.  Constitutional:      General: She is not in acute distress.    Appearance: She is well-developed.  HENT:     Head: Normocephalic and atraumatic.     Mouth/Throat:     Mouth: Mucous membranes are moist.     Pharynx: Oropharynx is clear.  Eyes:     Extraocular Movements: Extraocular movements intact.     Conjunctiva/sclera: Conjunctivae normal.  Pupils: Pupils are equal, round, and reactive to light.  Pulmonary:     Effort: Pulmonary effort is normal. No respiratory distress.     Breath sounds: Normal breath sounds.  Lymphadenopathy:     Cervical: No cervical adenopathy.  Skin:    General: Skin is warm.     Findings: Erythema and rash present.     Comments: Erythema on upper and lower eye lids (periocular). No  induration,edema,or local heat.   Neurological:     Mental Status: She is alert and oriented to person, place, and time.  Psychiatric:        Speech: Speech normal.     Comments: Well groomed, good eye contact.    ASSESSMENT AND PLAN:  Karen Johns was seen today for allergies and tubal reversal.  Diagnoses and all orders for this visit:  Dermatitis We discussed possible etiologies. ? Contact dermatitis,seborrheaic,dermatitis,and eczema to be considered. For now we will hold on derma evaluation but if not resolved it needs to be considered. She will try topical Desonide, small amount, avoiding contact with eye; and ketoconazole. Appropriate eye lid hygiene. Zyrtec 10 mg daily.  Instructed to let me know if still having rash in a couple of weeks, before if it gets worse.  -     ketoconazole (NIZORAL) 2 % cream; Apply 1 application topically daily for 14 days. -     desonide (DESOWEN) 0.05 % cream; Apply topically 2 (two) times daily for 14 days.  Reversal of sterilization Folic acid supplementation, 0.8-1 mg daily, recommended. She understands the risk due to advance maternal age, she follows a very healthy life style and no chronic diseases. Recommend establishing with ob/gyn.   Return if symptoms worsen or fail to improve.   Karen Hinds G. [place], MD  [facility]. [place] office.   A few things to remember from today's visit:   Dermatitis  If you need refills please call your pharmacy. Do not use My Chart to request refills or for acute issues that need immediate attention.   Desonide cream a very small amount around eyes. Baby shampoo to clean eye lids. Zyrtec 10 mg daily. If not resolved in 2 weeks, derma evaluation may be necessary.  Please be sure medication list is accurate. If a new problem present, please set up appointment sooner than planned today.

## 2021-04-01 ENCOUNTER — Encounter: Payer: Self-pay | Admitting: Family Medicine

## 2021-04-14 DIAGNOSIS — M9905 Segmental and somatic dysfunction of pelvic region: Secondary | ICD-10-CM | POA: Diagnosis not present

## 2021-04-14 DIAGNOSIS — M9903 Segmental and somatic dysfunction of lumbar region: Secondary | ICD-10-CM | POA: Diagnosis not present

## 2021-04-14 DIAGNOSIS — M9902 Segmental and somatic dysfunction of thoracic region: Secondary | ICD-10-CM | POA: Diagnosis not present

## 2021-04-14 DIAGNOSIS — M5431 Sciatica, right side: Secondary | ICD-10-CM | POA: Diagnosis not present

## 2021-04-21 DIAGNOSIS — M5431 Sciatica, right side: Secondary | ICD-10-CM | POA: Diagnosis not present

## 2021-04-21 DIAGNOSIS — M9903 Segmental and somatic dysfunction of lumbar region: Secondary | ICD-10-CM | POA: Diagnosis not present

## 2021-04-21 DIAGNOSIS — M9905 Segmental and somatic dysfunction of pelvic region: Secondary | ICD-10-CM | POA: Diagnosis not present

## 2021-04-21 DIAGNOSIS — M9902 Segmental and somatic dysfunction of thoracic region: Secondary | ICD-10-CM | POA: Diagnosis not present

## 2021-04-28 DIAGNOSIS — M5431 Sciatica, right side: Secondary | ICD-10-CM | POA: Diagnosis not present

## 2021-04-28 DIAGNOSIS — M9902 Segmental and somatic dysfunction of thoracic region: Secondary | ICD-10-CM | POA: Diagnosis not present

## 2021-04-28 DIAGNOSIS — M9903 Segmental and somatic dysfunction of lumbar region: Secondary | ICD-10-CM | POA: Diagnosis not present

## 2021-04-28 DIAGNOSIS — M9905 Segmental and somatic dysfunction of pelvic region: Secondary | ICD-10-CM | POA: Diagnosis not present

## 2021-05-05 DIAGNOSIS — M9902 Segmental and somatic dysfunction of thoracic region: Secondary | ICD-10-CM | POA: Diagnosis not present

## 2021-05-05 DIAGNOSIS — M9905 Segmental and somatic dysfunction of pelvic region: Secondary | ICD-10-CM | POA: Diagnosis not present

## 2021-05-05 DIAGNOSIS — M9903 Segmental and somatic dysfunction of lumbar region: Secondary | ICD-10-CM | POA: Diagnosis not present

## 2021-05-05 DIAGNOSIS — M5431 Sciatica, right side: Secondary | ICD-10-CM | POA: Diagnosis not present

## 2021-05-12 DIAGNOSIS — M9903 Segmental and somatic dysfunction of lumbar region: Secondary | ICD-10-CM | POA: Diagnosis not present

## 2021-05-12 DIAGNOSIS — M5431 Sciatica, right side: Secondary | ICD-10-CM | POA: Diagnosis not present

## 2021-05-12 DIAGNOSIS — M9905 Segmental and somatic dysfunction of pelvic region: Secondary | ICD-10-CM | POA: Diagnosis not present

## 2021-05-12 DIAGNOSIS — M9902 Segmental and somatic dysfunction of thoracic region: Secondary | ICD-10-CM | POA: Diagnosis not present

## 2021-05-19 DIAGNOSIS — M9905 Segmental and somatic dysfunction of pelvic region: Secondary | ICD-10-CM | POA: Diagnosis not present

## 2021-05-19 DIAGNOSIS — M9902 Segmental and somatic dysfunction of thoracic region: Secondary | ICD-10-CM | POA: Diagnosis not present

## 2021-05-19 DIAGNOSIS — M5431 Sciatica, right side: Secondary | ICD-10-CM | POA: Diagnosis not present

## 2021-05-19 DIAGNOSIS — M791 Myalgia, unspecified site: Secondary | ICD-10-CM | POA: Diagnosis not present

## 2021-05-19 DIAGNOSIS — M9903 Segmental and somatic dysfunction of lumbar region: Secondary | ICD-10-CM | POA: Diagnosis not present

## 2021-05-26 DIAGNOSIS — M9903 Segmental and somatic dysfunction of lumbar region: Secondary | ICD-10-CM | POA: Diagnosis not present

## 2021-05-26 DIAGNOSIS — M5431 Sciatica, right side: Secondary | ICD-10-CM | POA: Diagnosis not present

## 2021-05-26 DIAGNOSIS — M9905 Segmental and somatic dysfunction of pelvic region: Secondary | ICD-10-CM | POA: Diagnosis not present

## 2021-05-26 DIAGNOSIS — M9902 Segmental and somatic dysfunction of thoracic region: Secondary | ICD-10-CM | POA: Diagnosis not present

## 2021-05-26 DIAGNOSIS — M791 Myalgia, unspecified site: Secondary | ICD-10-CM | POA: Diagnosis not present

## 2021-06-02 DIAGNOSIS — M62830 Muscle spasm of back: Secondary | ICD-10-CM | POA: Diagnosis not present

## 2021-06-02 DIAGNOSIS — M9905 Segmental and somatic dysfunction of pelvic region: Secondary | ICD-10-CM | POA: Diagnosis not present

## 2021-06-02 DIAGNOSIS — M2560 Stiffness of unspecified joint, not elsewhere classified: Secondary | ICD-10-CM | POA: Diagnosis not present

## 2021-06-02 DIAGNOSIS — M9902 Segmental and somatic dysfunction of thoracic region: Secondary | ICD-10-CM | POA: Diagnosis not present

## 2021-06-02 DIAGNOSIS — M9903 Segmental and somatic dysfunction of lumbar region: Secondary | ICD-10-CM | POA: Diagnosis not present

## 2021-06-23 DIAGNOSIS — M9905 Segmental and somatic dysfunction of pelvic region: Secondary | ICD-10-CM | POA: Diagnosis not present

## 2021-06-23 DIAGNOSIS — M5431 Sciatica, right side: Secondary | ICD-10-CM | POA: Diagnosis not present

## 2021-06-23 DIAGNOSIS — M9903 Segmental and somatic dysfunction of lumbar region: Secondary | ICD-10-CM | POA: Diagnosis not present

## 2021-06-23 DIAGNOSIS — M2560 Stiffness of unspecified joint, not elsewhere classified: Secondary | ICD-10-CM | POA: Diagnosis not present

## 2021-06-23 DIAGNOSIS — M9902 Segmental and somatic dysfunction of thoracic region: Secondary | ICD-10-CM | POA: Diagnosis not present

## 2021-06-24 ENCOUNTER — Encounter (INDEPENDENT_AMBULATORY_CARE_PROVIDER_SITE_OTHER): Payer: Self-pay

## 2021-07-07 DIAGNOSIS — M9905 Segmental and somatic dysfunction of pelvic region: Secondary | ICD-10-CM | POA: Diagnosis not present

## 2021-07-07 DIAGNOSIS — M5431 Sciatica, right side: Secondary | ICD-10-CM | POA: Diagnosis not present

## 2021-07-07 DIAGNOSIS — M2560 Stiffness of unspecified joint, not elsewhere classified: Secondary | ICD-10-CM | POA: Diagnosis not present

## 2021-07-07 DIAGNOSIS — M9902 Segmental and somatic dysfunction of thoracic region: Secondary | ICD-10-CM | POA: Diagnosis not present

## 2021-07-07 DIAGNOSIS — M9903 Segmental and somatic dysfunction of lumbar region: Secondary | ICD-10-CM | POA: Diagnosis not present

## 2021-07-14 DIAGNOSIS — M9905 Segmental and somatic dysfunction of pelvic region: Secondary | ICD-10-CM | POA: Diagnosis not present

## 2021-07-14 DIAGNOSIS — M9902 Segmental and somatic dysfunction of thoracic region: Secondary | ICD-10-CM | POA: Diagnosis not present

## 2021-07-14 DIAGNOSIS — M5431 Sciatica, right side: Secondary | ICD-10-CM | POA: Diagnosis not present

## 2021-07-14 DIAGNOSIS — M2560 Stiffness of unspecified joint, not elsewhere classified: Secondary | ICD-10-CM | POA: Diagnosis not present

## 2021-07-14 DIAGNOSIS — M9903 Segmental and somatic dysfunction of lumbar region: Secondary | ICD-10-CM | POA: Diagnosis not present

## 2021-07-27 ENCOUNTER — Other Ambulatory Visit: Payer: BC Managed Care – PPO

## 2021-09-24 DIAGNOSIS — M9905 Segmental and somatic dysfunction of pelvic region: Secondary | ICD-10-CM | POA: Diagnosis not present

## 2021-09-24 DIAGNOSIS — M5431 Sciatica, right side: Secondary | ICD-10-CM | POA: Diagnosis not present

## 2021-09-24 DIAGNOSIS — M9903 Segmental and somatic dysfunction of lumbar region: Secondary | ICD-10-CM | POA: Diagnosis not present

## 2021-09-24 DIAGNOSIS — M9902 Segmental and somatic dysfunction of thoracic region: Secondary | ICD-10-CM | POA: Diagnosis not present

## 2021-09-29 DIAGNOSIS — M9905 Segmental and somatic dysfunction of pelvic region: Secondary | ICD-10-CM | POA: Diagnosis not present

## 2021-09-29 DIAGNOSIS — M9902 Segmental and somatic dysfunction of thoracic region: Secondary | ICD-10-CM | POA: Diagnosis not present

## 2021-09-29 DIAGNOSIS — M9903 Segmental and somatic dysfunction of lumbar region: Secondary | ICD-10-CM | POA: Diagnosis not present

## 2021-09-29 DIAGNOSIS — M5431 Sciatica, right side: Secondary | ICD-10-CM | POA: Diagnosis not present

## 2021-10-06 ENCOUNTER — Encounter: Payer: Self-pay | Admitting: Family Medicine

## 2021-10-06 ENCOUNTER — Other Ambulatory Visit: Payer: Self-pay

## 2021-10-06 ENCOUNTER — Ambulatory Visit (INDEPENDENT_AMBULATORY_CARE_PROVIDER_SITE_OTHER): Payer: BC Managed Care – PPO | Admitting: Family Medicine

## 2021-10-06 VITALS — BP 118/70 | HR 78 | Resp 12 | Ht 62.5 in | Wt 147.0 lb

## 2021-10-06 DIAGNOSIS — Z13228 Encounter for screening for other metabolic disorders: Secondary | ICD-10-CM | POA: Diagnosis not present

## 2021-10-06 DIAGNOSIS — Z1329 Encounter for screening for other suspected endocrine disorder: Secondary | ICD-10-CM | POA: Diagnosis not present

## 2021-10-06 DIAGNOSIS — Z0000 Encounter for general adult medical examination without abnormal findings: Secondary | ICD-10-CM | POA: Diagnosis not present

## 2021-10-06 DIAGNOSIS — Z130 Encounter for screening for diseases of the blood and blood-forming organs and certain disorders involving the immune mechanism: Secondary | ICD-10-CM

## 2021-10-06 DIAGNOSIS — Z13220 Encounter for screening for lipoid disorders: Secondary | ICD-10-CM | POA: Diagnosis not present

## 2021-10-06 DIAGNOSIS — Z0289 Encounter for other administrative examinations: Secondary | ICD-10-CM | POA: Diagnosis not present

## 2021-10-06 DIAGNOSIS — Z0189 Encounter for other specified special examinations: Secondary | ICD-10-CM

## 2021-10-06 LAB — LUTEINIZING HORMONE: LH: 3.16 m[IU]/mL

## 2021-10-06 LAB — LIPID PANEL
Cholesterol: 156 mg/dL (ref 0–200)
HDL: 28.9 mg/dL — ABNORMAL LOW (ref >39.00)
LDL Cholesterol: 101 mg/dL — ABNORMAL HIGH (ref 0–99)
NonHDL: 127.25
Total CHOL/HDL Ratio: 5
Triglycerides: 130 mg/dL (ref 0.0–149.0)
VLDL: 26 mg/dL (ref 0.0–40.0)

## 2021-10-06 LAB — FOLLICLE STIMULATING HORMONE: FSH: 5.2 m[IU]/mL

## 2021-10-06 LAB — CBC WITH DIFFERENTIAL/PLATELET
Basophils Absolute: 0 10*3/uL (ref 0.0–0.1)
Basophils Relative: 0.8 % (ref 0.0–3.0)
Eosinophils Absolute: 0.1 10*3/uL (ref 0.0–0.7)
Eosinophils Relative: 1.3 % (ref 0.0–5.0)
HCT: 45.5 % (ref 36.0–46.0)
Hemoglobin: 15.7 g/dL — ABNORMAL HIGH (ref 12.0–15.0)
Lymphocytes Relative: 25.2 % (ref 12.0–46.0)
Lymphs Abs: 1.5 10*3/uL (ref 0.7–4.0)
MCHC: 34.5 g/dL (ref 30.0–36.0)
MCV: 95.2 fl (ref 78.0–100.0)
Monocytes Absolute: 0.4 10*3/uL (ref 0.1–1.0)
Monocytes Relative: 6.2 % (ref 3.0–12.0)
Neutro Abs: 3.9 10*3/uL (ref 1.4–7.7)
Neutrophils Relative %: 66.5 % (ref 43.0–77.0)
Platelets: 321 10*3/uL (ref 150.0–400.0)
RBC: 4.78 Mil/uL (ref 3.87–5.11)
RDW: 13.5 % (ref 11.5–15.5)
WBC: 5.8 10*3/uL (ref 4.0–10.5)

## 2021-10-06 LAB — COMPREHENSIVE METABOLIC PANEL
ALT: 35 U/L (ref 0–35)
AST: 42 U/L — ABNORMAL HIGH (ref 0–37)
Albumin: 4.8 g/dL (ref 3.5–5.2)
Alkaline Phosphatase: 51 U/L (ref 39–117)
BUN: 22 mg/dL (ref 6–23)
CO2: 26 mEq/L (ref 19–32)
Calcium: 9.8 mg/dL (ref 8.4–10.5)
Chloride: 101 mEq/L (ref 96–112)
Creatinine, Ser: 1.14 mg/dL (ref 0.40–1.20)
GFR: 59.05 mL/min — ABNORMAL LOW (ref >60.00)
Glucose, Bld: 94 mg/dL (ref 70–99)
Potassium: 4.2 mEq/L (ref 3.5–5.1)
Sodium: 136 mEq/L (ref 135–145)
Total Bilirubin: 0.6 mg/dL (ref 0.2–1.2)
Total Protein: 7.6 g/dL (ref 6.0–8.3)

## 2021-10-06 LAB — T3, FREE: T3, Free: 3 pg/mL (ref 2.3–4.2)

## 2021-10-06 LAB — C-REACTIVE PROTEIN: CRP: <1 mg/dL (ref 0.5–20.0)

## 2021-10-06 LAB — T4, FREE: Free T4: 0.73 ng/dL (ref 0.60–1.60)

## 2021-10-06 LAB — TSH: TSH: 1.54 u[IU]/mL (ref 0.35–5.50)

## 2021-10-06 LAB — FERRITIN: Ferritin: 267.3 ng/mL (ref 10.0–291.0)

## 2021-10-06 NOTE — Patient Instructions (Addendum)
A few things to remember from today's visit:  Routine general medical examination at a health care facility  Screening for lipoid disorders - Plan: Lipid panel  Screening for endocrine, metabolic and immunity disorder - Plan: Comprehensive metabolic panel  Encounter for occupational physical examination - Plan: Lipid panel, Comprehensive metabolic panel, TSH, T3, free, T4, free, CBC with Differential/Platelet, C-reactive protein, Testosterone, Free, Total, SHBG, Follicle stimulating hormone, Luteinizing Hormone, Cortisol, free, Serum, Ferritin  Patient request for diagnostic testing - Plan: TSH, T3, free, T4, free, CBC with Differential/Platelet, C-reactive protein, Testosterone, Free, Total, SHBG, Follicle stimulating hormone, Luteinizing Hormone, Cortisol, free, Serum, Ferritin  Folic acid 0.8-1 mg daily.  Do not use My Chart to request refills or for acute issues that need immediate attention.   Please be sure medication list is accurate. If a new problem present, please set up appointment sooner than planned today. Health Maintenance, Female Adopting a healthy lifestyle and getting preventive care are important in promoting health and wellness. Ask your health care provider about: The right schedule for you to have regular tests and exams. Things you can do on your own to prevent diseases and keep yourself healthy. What should I know about diet, weight, and exercise? Eat a healthy diet  Eat a diet that includes plenty of vegetables, fruits, low-fat dairy products, and lean protein. Do not eat a lot of foods that are high in solid fats, added sugars, or sodium. Maintain a healthy weight Body mass index (BMI) is used to identify weight problems. It estimates body fat based on height and weight. Your health care provider can help determine your BMI and help you achieve or maintain a healthy weight. Get regular exercise Get regular exercise. This is one of the most important things you  can do for your health. Most adults should: Exercise for at least 150 minutes each week. The exercise should increase your heart rate and make you sweat (moderate-intensity exercise). Do strengthening exercises at least twice a week. This is in addition to the moderate-intensity exercise. Spend less time sitting. Even light physical activity can be beneficial. Watch cholesterol and blood lipids Have your blood tested for lipids and cholesterol at 43 years of age, then have this test every 5 years. Have your cholesterol levels checked more often if: Your lipid or cholesterol levels are high. You are older than 42 years of age. You are at high risk for heart disease. What should I know about cancer screening? Depending on your health history and family history, you may need to have cancer screening at various ages. This may include screening for: Breast cancer. Cervical cancer. Colorectal cancer. Skin cancer. Lung cancer. What should I know about heart disease, diabetes, and high blood pressure? Blood pressure and heart disease High blood pressure causes heart disease and increases the risk of stroke. This is more likely to develop in people who have high blood pressure readings, are of African descent, or are overweight. Have your blood pressure checked: Every 3-5 years if you are 36-6 years of age. Every year if you are 55 years old or older. Diabetes Have regular diabetes screenings. This checks your fasting blood sugar level. Have the screening done: Once every three years after age 32 if you are at a normal weight and have a low risk for diabetes. More often and at a younger age if you are overweight or have a high risk for diabetes. What should I know about preventing infection? Hepatitis B If you have a higher  risk for hepatitis B, you should be screened for this virus. Talk with your health care provider to find out if you are at risk for hepatitis B infection. Hepatitis  C Testing is recommended for: Everyone born from [birth year] through 1963. Anyone with known risk factors for hepatitis C. Sexually transmitted infections (STIs) Get screened for STIs, including gonorrhea and chlamydia, if: You are sexually active and are younger than 43 years of age. You are older than 43 years of age and your health care provider tells you that you are at risk for this type of infection. Your sexual activity has changed since you were last screened, and you are at increased risk for chlamydia or gonorrhea. Ask your health care provider if you are at risk. Ask your health care provider about whether you are at high risk for HIV. Your health care provider may recommend a prescription medicine to help prevent HIV infection. If you choose to take medicine to prevent HIV, you should first get tested for HIV. You should then be tested every 3 months for as long as you are taking the medicine. Pregnancy If you are about to stop having your period (premenopausal) and you may become pregnant, seek counseling before you get pregnant. Take 400 to 800 micrograms (mcg) of folic acid every day if you become pregnant. Ask for birth control (contraception) if you want to prevent pregnancy. Osteoporosis and menopause Osteoporosis is a disease in which the bones lose minerals and strength with aging. This can result in bone fractures. If you are 70 years old or older, or if you are at risk for osteoporosis and fractures, ask your health care provider if you should: Be screened for bone loss. Take a calcium or vitamin D supplement to lower your risk of fractures. Be given hormone replacement therapy (HRT) to treat symptoms of menopause. Follow these instructions at home: Lifestyle Do not use any products that contain nicotine or tobacco, such as cigarettes, e-cigarettes, and chewing tobacco. If you need help quitting, ask your health care provider. Do not use street drugs. Do not share needles. Ask  your health care provider for help if you need support or information about quitting drugs. Alcohol use Do not drink alcohol if: Your health care provider tells you not to drink. You are pregnant, may be pregnant, or are planning to become pregnant. If you drink alcohol: Limit how much you use to 0-1 drink a day. Limit intake if you are breastfeeding. Be aware of how much alcohol is in your drink. In the [place] one drink equals one 12 oz bottle of beer (355 mL), one 5 oz glass of wine (148 mL), or one 1 oz glass of hard liquor (44 mL). General instructions Schedule regular health, dental, and eye exams. Stay current with your vaccines. Tell your health care provider if: You often feel depressed. You have ever been abused or do not feel safe at home. Summary Adopting a healthy lifestyle and getting preventive care are important in promoting health and wellness. Follow your health care provider's instructions about healthy diet, exercising, and getting tested or screened for diseases. Follow your health care provider's instructions on monitoring your cholesterol and blood pressure. This information is not intended to replace advice given to you by your health care provider. Make sure you discuss any questions you have with your health care provider. Document Revised: 02/12/2021 Document Reviewed: 11/28/2018 Elsevier Patient Education  2022 [employer].

## 2021-10-06 NOTE — Progress Notes (Signed)
HPI: [name] is a 45 y.o. female, who is here today for her routine physical.  Last CPE: 09/29/20  Regular exercise 3 or more time per week: She is a Systems analyst, cardio and wt lifting daily.  She has been in bodybuilding competitions recently, so she has gained muscle mass. Following a healthy diet: Yes, rarely eats out.  She lives with her husband   Chronic medical problems:Otherwise healthy except for allergic rhinitis and acne. She applied tretinoin cream on face, helps with acne.   Pap smear:07/2018 negative malignancy and HPV not detected. Hx of abnormal pap smears: Negative. LMP 09/05/21, she feels like she is going to start in the next 1-2 days. She is not on birth control, still planning on getting pregnant.  Immunization History  Administered Date(s) Administered   Influenza-Unspecified 09/30/2017, 07/26/2018, 07/23/2019   Tdap 12/19/2012, 08/15/2013   Mammogram: 12/31/20 for about 10 years she has had some abnormalities on mammogram, she has had 2 [place]. 6 months f/u recommended. She paid $600 dollars, so decided to do it in 01/2021.  Colonoscopy: 12/20/19. DEXA: N/A Hep C screening: 09/29/20 NR  She has no concerns today. Because her age, she needs to have some labs done to be allowed to continue participation in body building events. Some of the labs she has listed are ferritin, TSH, free T4 and T3, testosterone (total and free), cortisol is saliva, homocystine, CBC with differential, hepatic, renal, lipid panel, and CRP among some.   Lab Results  Component Value Date   CREATININE 1.07 09/29/2020   BUN 18 09/29/2020   NA 138 09/29/2020   K 4.8 09/29/2020   CL 101 09/29/2020   CO2 26 09/29/2020  07/30/21 was her last competition. She is feeling great and feels like she can continue body building, it is something she really enjoys.  Review of Systems  Constitutional:  Negative for activity change, appetite change, chills, fatigue and fever.  HENT:   Negative for dental problem, hearing loss, mouth sores, sore throat, trouble swallowing and voice change.   Eyes:  Negative for redness and visual disturbance.  Respiratory:  Negative for cough, shortness of breath and wheezing.   Cardiovascular:  Negative for chest pain, palpitations and leg swelling.  Gastrointestinal:  Negative for abdominal pain, nausea and vomiting.       No changes in bowel habits.  Endocrine: Negative for cold intolerance, heat intolerance, polydipsia, polyphagia and polyuria.  Genitourinary:  Negative for decreased urine volume, dysuria, hematuria, vaginal bleeding and vaginal discharge.  Musculoskeletal:  Negative for gait problem and myalgias.  Skin:  Negative for color change and rash.  Allergic/Immunologic: Positive for environmental allergies.  Neurological:  Negative for syncope, weakness and headaches.  Hematological:  Negative for adenopathy. Does not bruise/bleed easily.  Psychiatric/Behavioral:  Negative for confusion and sleep disturbance. The patient is not nervous/anxious.   All other systems reviewed and are negative.  Current Outpatient Medications on File Prior to Visit  Medication Sig Dispense Refill   Multiple Vitamins-Minerals (MULTIVITAMIN ADULT PO) Take by mouth daily.     No current facility-administered medications on file prior to visit.   Past Medical History:  Diagnosis Date   Allergy    Past Surgical History:  Procedure Laterality Date   CESAREAN SECTION     02/03/2004 and 12/22/2005   TUBAL LIGATION Bilateral 12/22/2005   Allergies  Allergen Reactions   Shellfish Allergy    Family History  Problem Relation Age of Onset   Diabetes Father  Diabetes Paternal Aunt    Diabetes Paternal Uncle    Cancer Maternal Grandmother        breast   Breast cancer Maternal Grandmother    Diabetes Paternal Grandfather    Social History   Socioeconomic History   Marital status: Married    Spouse name: Not on file   Number of children:  Not on file   Years of education: Not on file   Highest education level: Not on file  Occupational History   Not on file  Tobacco Use   Smoking status: Former   Smokeless tobacco: Never  Substance and Sexual Activity   Alcohol use: No   Drug use: No   Sexual activity: Yes    Birth control/protection: Surgical  Other Topics Concern   Not on file  Social History Narrative   Not on file   Social Determinants of Health   Financial Resource Strain: Not on file  Food Insecurity: Not on file  Transportation Needs: Not on file  Physical Activity: Not on file  Stress: Not on file  Social Connections: Not on file   Vitals:   10/06/21 0755  BP: 118/70  Pulse: 78  Resp: 12  SpO2: 98%   Body mass index is 26.46 kg/m.  Wt Readings from Last 3 Encounters:  10/06/21 147 lb (66.7 kg)  03/26/21 141 lb (64 kg)  09/29/20 144 lb 8 oz (65.5 kg)   Physical Exam Vitals and nursing note reviewed.  Constitutional:      General: She is not in acute distress.    Appearance: She is well-developed.  HENT:     Head: Normocephalic and atraumatic.     Right Ear: Hearing, tympanic membrane, ear canal and external ear normal.     Left Ear: Hearing, tympanic membrane, ear canal and external ear normal.     Mouth/Throat:     Mouth: Mucous membranes are moist.     Pharynx: Oropharynx is clear. Uvula midline.  Eyes:     Extraocular Movements: Extraocular movements intact.     Conjunctiva/sclera: Conjunctivae normal.     Pupils: Pupils are equal, round, and reactive to light.  Neck:     Thyroid: No thyromegaly.     Trachea: No tracheal deviation.  Cardiovascular:     Rate and Rhythm: Normal rate and regular rhythm.     Pulses:          Dorsalis pedis pulses are 2+ on the right side and 2+ on the left side.     Heart sounds: No murmur heard. Pulmonary:     Effort: Pulmonary effort is normal. No respiratory distress.     Breath sounds: Normal breath sounds.  Abdominal:     Palpations:  Abdomen is soft. There is no hepatomegaly or mass.     Tenderness: There is no abdominal tenderness.  Genitourinary:    Comments: Breast: Fibrocystic like changes noted with palpation, other quadrants, bilateral.  No masses, nipple discharge, or skin changes. Musculoskeletal:     Comments: No major deformity or signs of synovitis appreciated.  Lymphadenopathy:     Cervical: No cervical adenopathy.     Upper Body:     Right upper body: No supraclavicular or axillary adenopathy.     Left upper body: No supraclavicular or axillary adenopathy.  Skin:    General: Skin is warm.     Findings: No erythema or rash.  Neurological:     General: No focal deficit present.     Mental Status:  She is alert and oriented to person, place, and time.     Cranial Nerves: No cranial nerve deficit.     Coordination: Coordination normal.     Gait: Gait normal.     Deep Tendon Reflexes:     Reflex Scores:      Bicep reflexes are 2+ on the right side and 2+ on the left side.      Patellar reflexes are 2+ on the right side and 2+ on the left side. Psychiatric:        Speech: Speech normal.     Comments: Well groomed, good eye contact.   ASSESSMENT AND PLAN:  Ms. Karen Johns was here today annual physical examination.  Orders Placed This Encounter  Procedures   Lipid panel   Comprehensive metabolic panel   TSH   T3, free   T4, free   CBC with Differential/Platelet   C-reactive protein   Testosterone, Free, Total, SHBG   Follicle stimulating hormone   Luteinizing Hormone   Cortisol, free, Serum   Ferritin   Lab Results  Component Value Date   WBC 5.8 10/06/2021   HGB 15.7 (H) 10/06/2021   HCT 45.5 10/06/2021   MCV 95.2 10/06/2021   PLT 321.0 10/06/2021   Lab Results  Component Value Date   CHOL 156 10/06/2021   HDL 28.90 (L) 10/06/2021   LDLCALC 101 (H) 10/06/2021   TRIG 130.0 10/06/2021   CHOLHDL 5 10/06/2021   Lab Results  Component Value Date   CREATININE 1.14 10/06/2021    BUN 22 10/06/2021   NA 136 10/06/2021   K 4.2 10/06/2021   CL 101 10/06/2021   CO2 26 10/06/2021   Lab Results  Component Value Date   ALT 35 10/06/2021   AST 42 (H) 10/06/2021   ALKPHOS 51 10/06/2021   BILITOT 0.6 10/06/2021   Lab Results  Component Value Date   TSH 1.54 10/06/2021   Routine general medical examination at a health care facility We discussed the importance of regular physical activity and healthy diet for prevention of chronic illness and/or complications. Preventive guidelines reviewed. Vaccination up-to-date, declined flu vaccine, history of allergy reaction. She and her husband is still interested in getting pregnant, we discussed the risk of pregnancy at her age. Daily folic acid supplementation recommended. Next CPE in a year.  Screening for lipoid disorders -     Lipid panel  Screening for endocrine, metabolic and immunity disorder -     Comprehensive metabolic panel  Encounter for occupational physical examination Lab orders placed as requested, she understands some of these labs will not be covered by her health insurance.  -     TSH -     T3, free -     T4, free -     CBC with Differential/Platelet -     C-reactive protein -     Testosterone, Free, Total, SHBG; Future -     Follicle stimulating hormone; Future -     Luteinizing Hormone -     Cortisol, free, Serum; Future -     Ferritin -     Cortisol, free, Serum -     Follicle stimulating hormone -     Testosterone, Free, Total, SHBG -     Comprehensive metabolic panel -     Lipid panel  Patient request for diagnostic testing -     TSH -     T3, free -     T4, free -  CBC with Differential/Platelet -     C-reactive protein -     Testosterone, Free, Total, SHBG; Future -     Follicle stimulating hormone; Future -     Luteinizing Hormone -     Cortisol, free, Serum; Future -     Ferritin -     Cortisol, free, Serum -     Follicle stimulating hormone -     Testosterone, Free,  Total, SHBG  Return in 1 year (on 10/06/2022) for cpe.  Zyquan Crotty G. [place], MD  [facility]. [place] office.

## 2021-10-08 DIAGNOSIS — M2560 Stiffness of unspecified joint, not elsewhere classified: Secondary | ICD-10-CM | POA: Diagnosis not present

## 2021-10-08 DIAGNOSIS — M9902 Segmental and somatic dysfunction of thoracic region: Secondary | ICD-10-CM | POA: Diagnosis not present

## 2021-10-08 DIAGNOSIS — M9903 Segmental and somatic dysfunction of lumbar region: Secondary | ICD-10-CM | POA: Diagnosis not present

## 2021-10-08 DIAGNOSIS — M9905 Segmental and somatic dysfunction of pelvic region: Secondary | ICD-10-CM | POA: Diagnosis not present

## 2021-10-08 DIAGNOSIS — M5431 Sciatica, right side: Secondary | ICD-10-CM | POA: Diagnosis not present

## 2021-10-09 LAB — TESTOSTERONE, FREE, TOTAL, SHBG
Sex Hormone Binding: 16.1 nmol/L — ABNORMAL LOW (ref 24.6–122.0)
Testosterone, Free: 2.6 pg/mL (ref 0.0–4.2)
Testosterone: 3 ng/dL — ABNORMAL LOW (ref 4–50)

## 2021-10-12 LAB — CORTISOL, FREE: Cortisol Free, Ser: 0.23 ug/dL

## 2021-12-31 DIAGNOSIS — M9905 Segmental and somatic dysfunction of pelvic region: Secondary | ICD-10-CM | POA: Diagnosis not present

## 2021-12-31 DIAGNOSIS — M9902 Segmental and somatic dysfunction of thoracic region: Secondary | ICD-10-CM | POA: Diagnosis not present

## 2021-12-31 DIAGNOSIS — M2560 Stiffness of unspecified joint, not elsewhere classified: Secondary | ICD-10-CM | POA: Diagnosis not present

## 2021-12-31 DIAGNOSIS — M9903 Segmental and somatic dysfunction of lumbar region: Secondary | ICD-10-CM | POA: Diagnosis not present

## 2021-12-31 DIAGNOSIS — M5431 Sciatica, right side: Secondary | ICD-10-CM | POA: Diagnosis not present

## 2022-01-07 DIAGNOSIS — M5431 Sciatica, right side: Secondary | ICD-10-CM | POA: Diagnosis not present

## 2022-01-07 DIAGNOSIS — M9902 Segmental and somatic dysfunction of thoracic region: Secondary | ICD-10-CM | POA: Diagnosis not present

## 2022-01-07 DIAGNOSIS — M9903 Segmental and somatic dysfunction of lumbar region: Secondary | ICD-10-CM | POA: Diagnosis not present

## 2022-01-07 DIAGNOSIS — M9905 Segmental and somatic dysfunction of pelvic region: Secondary | ICD-10-CM | POA: Diagnosis not present

## 2022-01-14 DIAGNOSIS — M9902 Segmental and somatic dysfunction of thoracic region: Secondary | ICD-10-CM | POA: Diagnosis not present

## 2022-01-14 DIAGNOSIS — M9903 Segmental and somatic dysfunction of lumbar region: Secondary | ICD-10-CM | POA: Diagnosis not present

## 2022-01-14 DIAGNOSIS — M9905 Segmental and somatic dysfunction of pelvic region: Secondary | ICD-10-CM | POA: Diagnosis not present

## 2022-01-14 DIAGNOSIS — M5431 Sciatica, right side: Secondary | ICD-10-CM | POA: Diagnosis not present

## 2022-01-26 DIAGNOSIS — M5431 Sciatica, right side: Secondary | ICD-10-CM | POA: Diagnosis not present

## 2022-01-26 DIAGNOSIS — M9905 Segmental and somatic dysfunction of pelvic region: Secondary | ICD-10-CM | POA: Diagnosis not present

## 2022-01-26 DIAGNOSIS — M9902 Segmental and somatic dysfunction of thoracic region: Secondary | ICD-10-CM | POA: Diagnosis not present

## 2022-01-26 DIAGNOSIS — M9903 Segmental and somatic dysfunction of lumbar region: Secondary | ICD-10-CM | POA: Diagnosis not present

## 2022-02-02 DIAGNOSIS — M9902 Segmental and somatic dysfunction of thoracic region: Secondary | ICD-10-CM | POA: Diagnosis not present

## 2022-02-02 DIAGNOSIS — M9905 Segmental and somatic dysfunction of pelvic region: Secondary | ICD-10-CM | POA: Diagnosis not present

## 2022-02-02 DIAGNOSIS — M5431 Sciatica, right side: Secondary | ICD-10-CM | POA: Diagnosis not present

## 2022-02-02 DIAGNOSIS — M9903 Segmental and somatic dysfunction of lumbar region: Secondary | ICD-10-CM | POA: Diagnosis not present

## 2022-02-09 DIAGNOSIS — M5431 Sciatica, right side: Secondary | ICD-10-CM | POA: Diagnosis not present

## 2022-02-09 DIAGNOSIS — M9902 Segmental and somatic dysfunction of thoracic region: Secondary | ICD-10-CM | POA: Diagnosis not present

## 2022-02-09 DIAGNOSIS — M9903 Segmental and somatic dysfunction of lumbar region: Secondary | ICD-10-CM | POA: Diagnosis not present

## 2022-02-09 DIAGNOSIS — M9905 Segmental and somatic dysfunction of pelvic region: Secondary | ICD-10-CM | POA: Diagnosis not present

## 2022-02-18 DIAGNOSIS — M9903 Segmental and somatic dysfunction of lumbar region: Secondary | ICD-10-CM | POA: Diagnosis not present

## 2022-02-18 DIAGNOSIS — M9902 Segmental and somatic dysfunction of thoracic region: Secondary | ICD-10-CM | POA: Diagnosis not present

## 2022-02-18 DIAGNOSIS — M5431 Sciatica, right side: Secondary | ICD-10-CM | POA: Diagnosis not present

## 2022-02-18 DIAGNOSIS — M9905 Segmental and somatic dysfunction of pelvic region: Secondary | ICD-10-CM | POA: Diagnosis not present

## 2022-02-25 DIAGNOSIS — M9905 Segmental and somatic dysfunction of pelvic region: Secondary | ICD-10-CM | POA: Diagnosis not present

## 2022-02-25 DIAGNOSIS — M9903 Segmental and somatic dysfunction of lumbar region: Secondary | ICD-10-CM | POA: Diagnosis not present

## 2022-02-25 DIAGNOSIS — M5431 Sciatica, right side: Secondary | ICD-10-CM | POA: Diagnosis not present

## 2022-02-25 DIAGNOSIS — M9902 Segmental and somatic dysfunction of thoracic region: Secondary | ICD-10-CM | POA: Diagnosis not present

## 2022-03-04 DIAGNOSIS — M9905 Segmental and somatic dysfunction of pelvic region: Secondary | ICD-10-CM | POA: Diagnosis not present

## 2022-03-04 DIAGNOSIS — M9903 Segmental and somatic dysfunction of lumbar region: Secondary | ICD-10-CM | POA: Diagnosis not present

## 2022-03-04 DIAGNOSIS — M5431 Sciatica, right side: Secondary | ICD-10-CM | POA: Diagnosis not present

## 2022-03-04 DIAGNOSIS — M9902 Segmental and somatic dysfunction of thoracic region: Secondary | ICD-10-CM | POA: Diagnosis not present

## 2022-03-11 DIAGNOSIS — M5431 Sciatica, right side: Secondary | ICD-10-CM | POA: Diagnosis not present

## 2022-03-11 DIAGNOSIS — M9902 Segmental and somatic dysfunction of thoracic region: Secondary | ICD-10-CM | POA: Diagnosis not present

## 2022-03-11 DIAGNOSIS — M9903 Segmental and somatic dysfunction of lumbar region: Secondary | ICD-10-CM | POA: Diagnosis not present

## 2022-03-11 DIAGNOSIS — M9905 Segmental and somatic dysfunction of pelvic region: Secondary | ICD-10-CM | POA: Diagnosis not present

## 2022-03-18 DIAGNOSIS — M9902 Segmental and somatic dysfunction of thoracic region: Secondary | ICD-10-CM | POA: Diagnosis not present

## 2022-03-18 DIAGNOSIS — M5431 Sciatica, right side: Secondary | ICD-10-CM | POA: Diagnosis not present

## 2022-03-18 DIAGNOSIS — M9905 Segmental and somatic dysfunction of pelvic region: Secondary | ICD-10-CM | POA: Diagnosis not present

## 2022-03-18 DIAGNOSIS — M9903 Segmental and somatic dysfunction of lumbar region: Secondary | ICD-10-CM | POA: Diagnosis not present

## 2022-04-01 DIAGNOSIS — M5431 Sciatica, right side: Secondary | ICD-10-CM | POA: Diagnosis not present

## 2022-04-01 DIAGNOSIS — M9903 Segmental and somatic dysfunction of lumbar region: Secondary | ICD-10-CM | POA: Diagnosis not present

## 2022-04-01 DIAGNOSIS — M9902 Segmental and somatic dysfunction of thoracic region: Secondary | ICD-10-CM | POA: Diagnosis not present

## 2022-04-01 DIAGNOSIS — M2560 Stiffness of unspecified joint, not elsewhere classified: Secondary | ICD-10-CM | POA: Diagnosis not present

## 2022-04-01 DIAGNOSIS — M9905 Segmental and somatic dysfunction of pelvic region: Secondary | ICD-10-CM | POA: Diagnosis not present

## 2022-04-18 DIAGNOSIS — M5431 Sciatica, right side: Secondary | ICD-10-CM | POA: Diagnosis not present

## 2022-04-18 DIAGNOSIS — M9905 Segmental and somatic dysfunction of pelvic region: Secondary | ICD-10-CM | POA: Diagnosis not present

## 2022-04-18 DIAGNOSIS — M9902 Segmental and somatic dysfunction of thoracic region: Secondary | ICD-10-CM | POA: Diagnosis not present

## 2022-04-18 DIAGNOSIS — M9903 Segmental and somatic dysfunction of lumbar region: Secondary | ICD-10-CM | POA: Diagnosis not present

## 2022-04-21 DIAGNOSIS — M9902 Segmental and somatic dysfunction of thoracic region: Secondary | ICD-10-CM | POA: Diagnosis not present

## 2022-04-21 DIAGNOSIS — M9905 Segmental and somatic dysfunction of pelvic region: Secondary | ICD-10-CM | POA: Diagnosis not present

## 2022-04-21 DIAGNOSIS — M9903 Segmental and somatic dysfunction of lumbar region: Secondary | ICD-10-CM | POA: Diagnosis not present

## 2022-04-21 DIAGNOSIS — M5431 Sciatica, right side: Secondary | ICD-10-CM | POA: Diagnosis not present

## 2022-04-25 IMAGING — MG DIGITAL SCREENING BILAT W/ TOMO W/ CAD
8 series · 9 of 24 positions shown · non-contrast
Comparison: Previous exam(s).

CLINICAL DATA: Screening.

EXAM:
DIGITAL SCREENING BILATERAL MAMMOGRAM WITH TOMO AND CAD

[L MLO synth-2D]
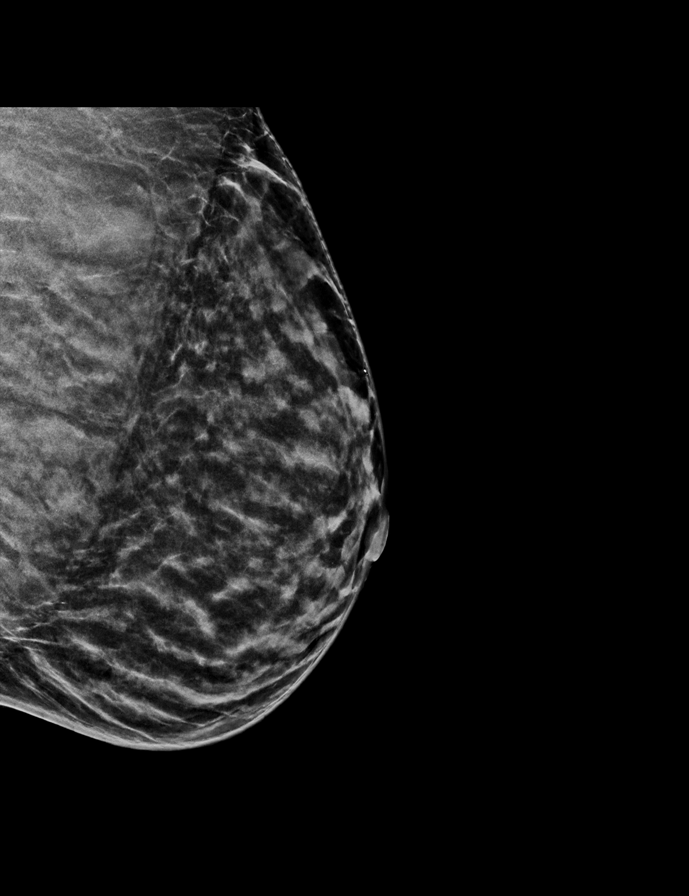

[L CC synth-2D]
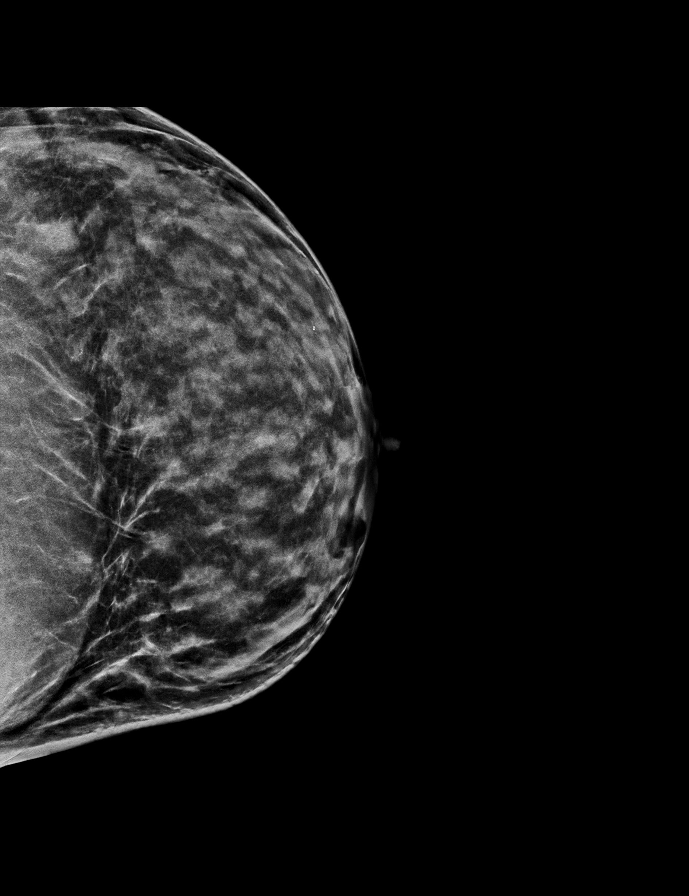

[R MLO synth-2D]
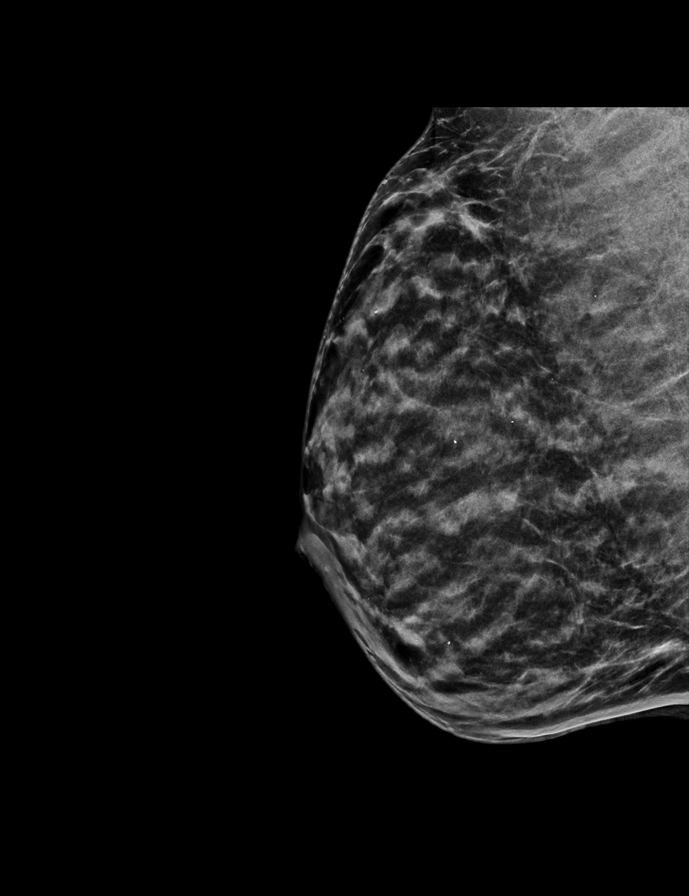

[R CC synth-2D]
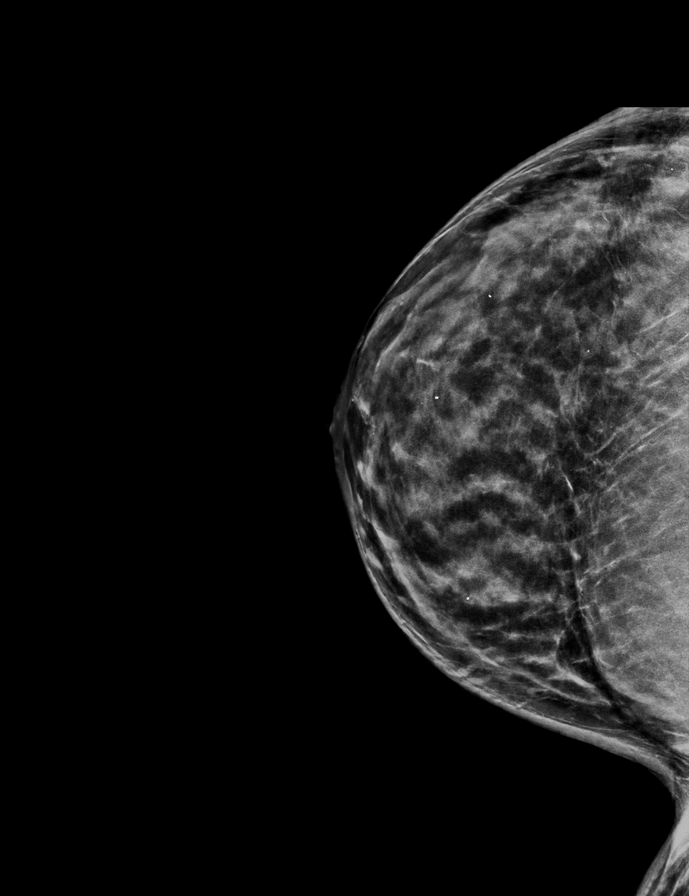

[R CC tomo · 2 of 54 frames shown]
[frame 18/54]
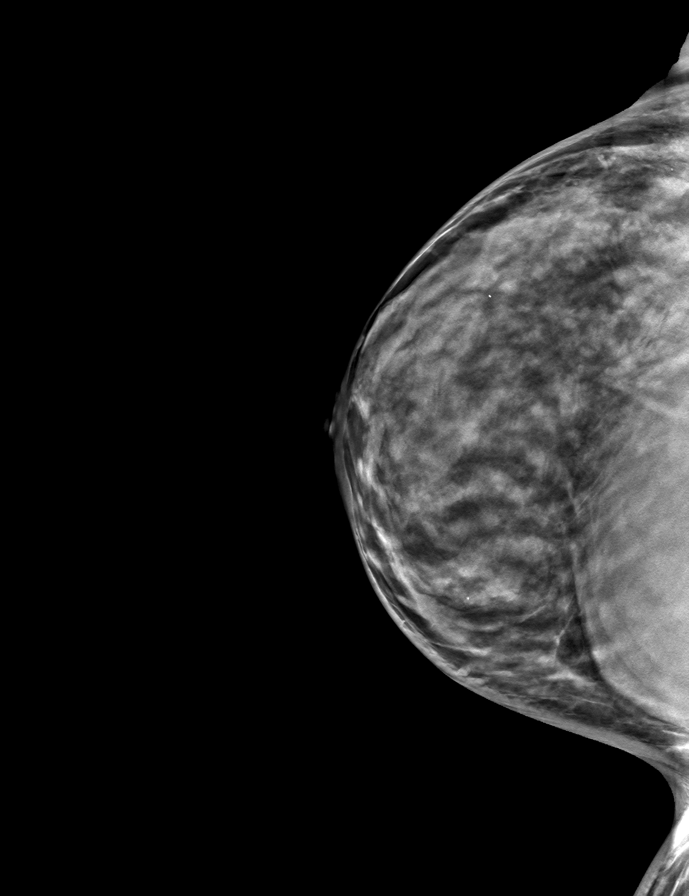
[frame 27/54]
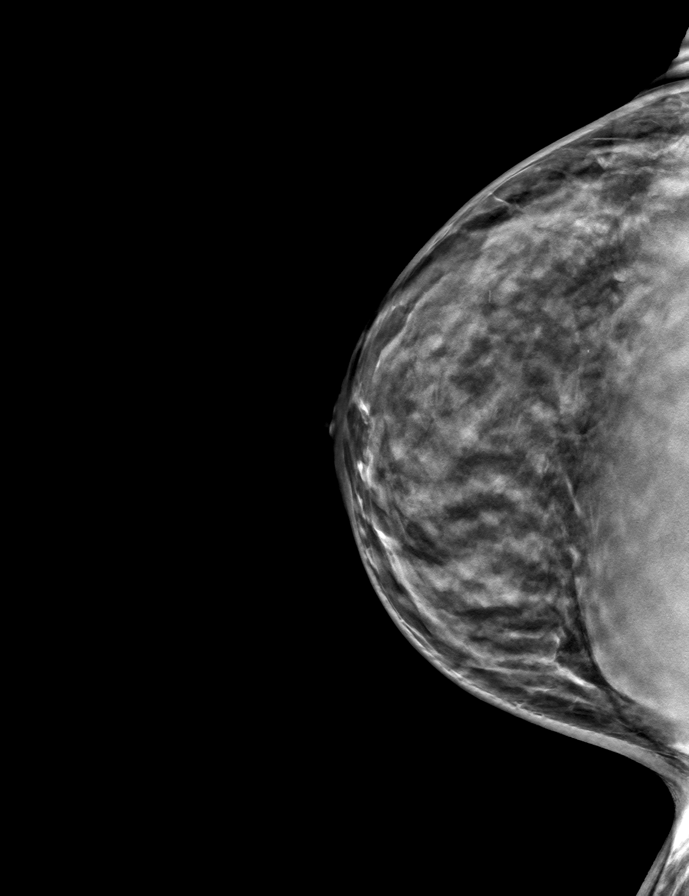

[L CC tomo · tomo slice 25/49.0]
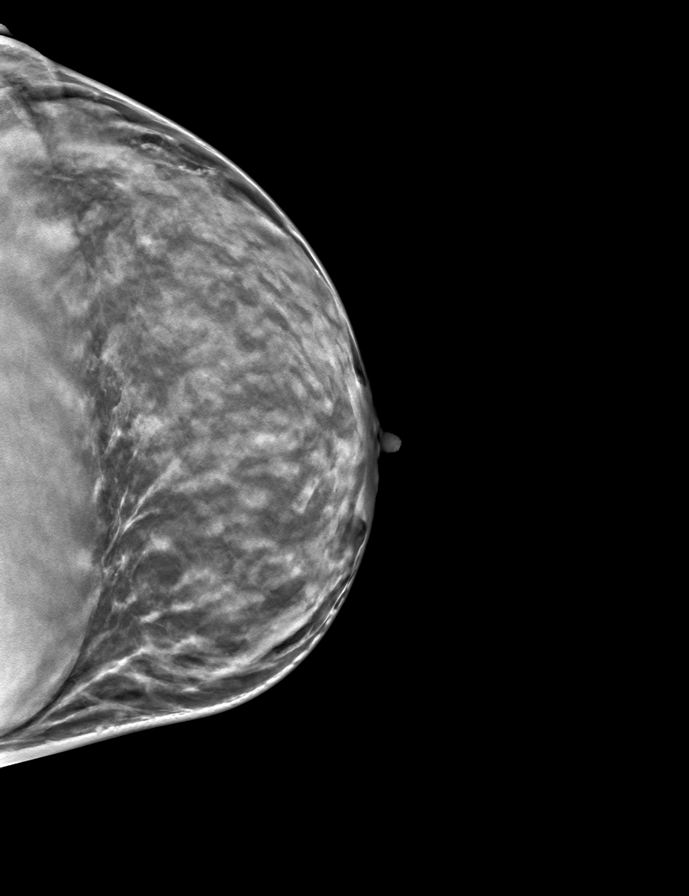

[L MLO tomo · tomo slice 25/48.0]
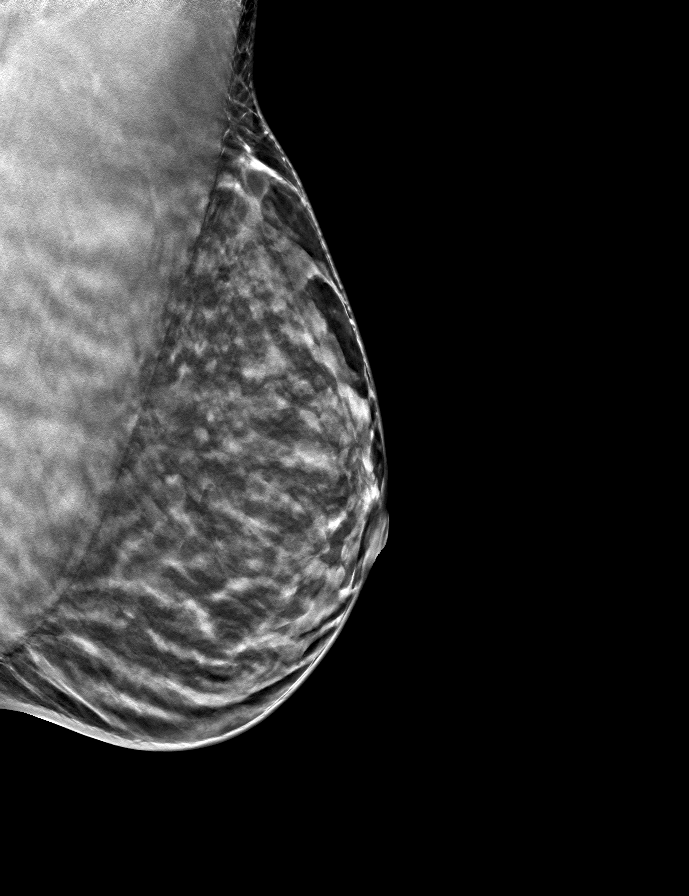

[R MLO tomo · tomo slice 23/46.0]
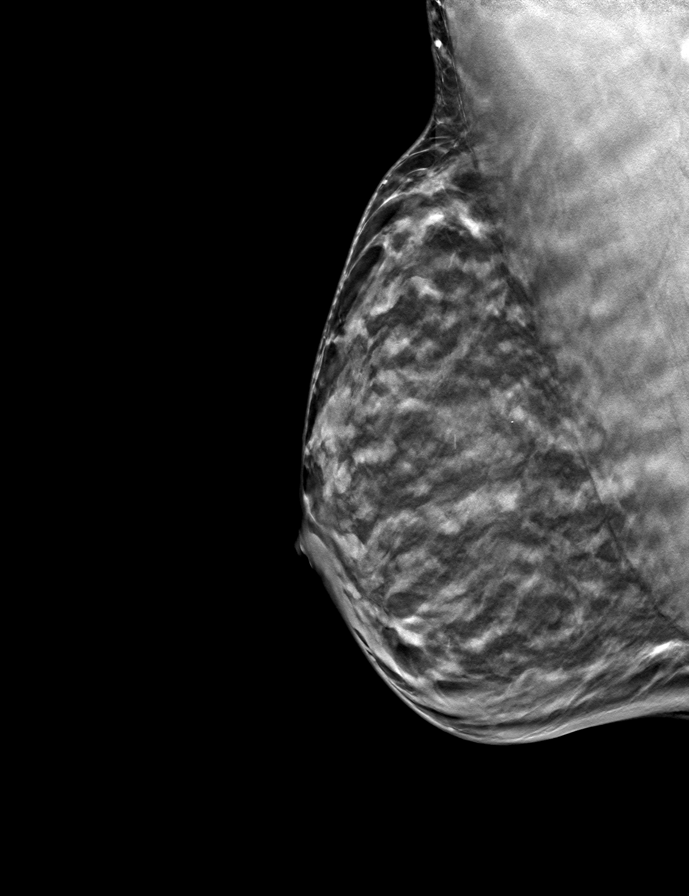

[9 of 24 positions shown; findings below may reference images not displayed]

ACR Breast Density Category c: The breast tissue is heterogeneously
dense, which may obscure small masses.
FINDINGS: In the left breast, a possible asymmetry warrants further
evaluation. In the right breast, no findings suspicious for
malignancy. Images were processed with CAD.
IMPRESSION: Further evaluation is suggested for possible asymmetry in the left
breast.

RECOMMENDATION:
Diagnostic mammogram and possibly ultrasound of the left breast.
(Code:[id number])

The patient will be contacted regarding the findings, and additional
imaging will be scheduled.

BI-RADS CATEGORY  0: Incomplete. Need additional imaging evaluation
and/or prior mammograms for comparison.

## 2022-04-26 DIAGNOSIS — M9905 Segmental and somatic dysfunction of pelvic region: Secondary | ICD-10-CM | POA: Diagnosis not present

## 2022-04-26 DIAGNOSIS — M9903 Segmental and somatic dysfunction of lumbar region: Secondary | ICD-10-CM | POA: Diagnosis not present

## 2022-04-26 DIAGNOSIS — M9902 Segmental and somatic dysfunction of thoracic region: Secondary | ICD-10-CM | POA: Diagnosis not present

## 2022-04-26 DIAGNOSIS — M5431 Sciatica, right side: Secondary | ICD-10-CM | POA: Diagnosis not present

## 2022-05-25 DIAGNOSIS — M9903 Segmental and somatic dysfunction of lumbar region: Secondary | ICD-10-CM | POA: Diagnosis not present

## 2022-05-25 DIAGNOSIS — M9902 Segmental and somatic dysfunction of thoracic region: Secondary | ICD-10-CM | POA: Diagnosis not present

## 2022-05-25 DIAGNOSIS — M5431 Sciatica, right side: Secondary | ICD-10-CM | POA: Diagnosis not present

## 2022-05-25 DIAGNOSIS — M9905 Segmental and somatic dysfunction of pelvic region: Secondary | ICD-10-CM | POA: Diagnosis not present

## 2022-05-30 DIAGNOSIS — M9903 Segmental and somatic dysfunction of lumbar region: Secondary | ICD-10-CM | POA: Diagnosis not present

## 2022-05-30 DIAGNOSIS — M9906 Segmental and somatic dysfunction of lower extremity: Secondary | ICD-10-CM | POA: Diagnosis not present

## 2022-05-30 DIAGNOSIS — M9902 Segmental and somatic dysfunction of thoracic region: Secondary | ICD-10-CM | POA: Diagnosis not present

## 2022-05-30 DIAGNOSIS — M5431 Sciatica, right side: Secondary | ICD-10-CM | POA: Diagnosis not present

## 2022-05-30 DIAGNOSIS — M9905 Segmental and somatic dysfunction of pelvic region: Secondary | ICD-10-CM | POA: Diagnosis not present

## 2022-06-06 DIAGNOSIS — M9903 Segmental and somatic dysfunction of lumbar region: Secondary | ICD-10-CM | POA: Diagnosis not present

## 2022-06-06 DIAGNOSIS — M9902 Segmental and somatic dysfunction of thoracic region: Secondary | ICD-10-CM | POA: Diagnosis not present

## 2022-06-06 DIAGNOSIS — M9905 Segmental and somatic dysfunction of pelvic region: Secondary | ICD-10-CM | POA: Diagnosis not present

## 2022-06-06 DIAGNOSIS — M5431 Sciatica, right side: Secondary | ICD-10-CM | POA: Diagnosis not present

## 2022-06-06 DIAGNOSIS — M9906 Segmental and somatic dysfunction of lower extremity: Secondary | ICD-10-CM | POA: Diagnosis not present

## 2022-06-13 DIAGNOSIS — M5431 Sciatica, right side: Secondary | ICD-10-CM | POA: Diagnosis not present

## 2022-06-13 DIAGNOSIS — M9905 Segmental and somatic dysfunction of pelvic region: Secondary | ICD-10-CM | POA: Diagnosis not present

## 2022-06-13 DIAGNOSIS — M9902 Segmental and somatic dysfunction of thoracic region: Secondary | ICD-10-CM | POA: Diagnosis not present

## 2022-06-13 DIAGNOSIS — M9906 Segmental and somatic dysfunction of lower extremity: Secondary | ICD-10-CM | POA: Diagnosis not present

## 2022-06-13 DIAGNOSIS — M9903 Segmental and somatic dysfunction of lumbar region: Secondary | ICD-10-CM | POA: Diagnosis not present

## 2022-11-04 NOTE — Progress Notes (Unsigned)
HPI: Ms.Jeorgia Johns is a 46 y.o. female, who is here today for her routine physical.  Last CPE: 10/06/21  She reports no new problems since her last visit.  She is still following a healthful diet and exercise regularly, competitive bodybuilder.  She is still considering trying to get pregnant, her last menstrual period was in February 2023,she attributes the irregularity to her participation in athletic competitions, which has led to a body fat percentage of about 6%. She mentions that it usually takes a few months for her menstrual cycle to return after competing.  She is currently using a testosterone cream prescribed by provider in a "hormonal clinic." She follows up with the clinic every three months for lab tests to monitor her hormone levels as well as other labs.  She reports sleeping an average of six to seven hours per night.  She does not drink alcohol or smoke.  Immunization History  Administered Date(s) Administered   Influenza-Unspecified 09/30/2017, 07/26/2018, 07/23/2019   Tdap 12/19/2012, 08/15/2013   Health Maintenance  Topic Date Due   COVID-19 Vaccine (1) 11/23/2022 (Originally 12/22/1976)   INFLUENZA VACCINE  03/19/2023 (Originally 07/19/2022)   PAP SMEAR-Modifier  07/21/2023   Hepatitis C Screening  Completed   HIV Screening  Completed   HPV VACCINES  Aged Out   Last mammogram 01/2021 Bi-Rads 3.  Acne: She has not seen her dermatologist, last visit Accutane was recommended but she prefers to continue topical treatment, Clindamycin and Retin A.  Review of Systems  Constitutional:  Negative for activity change, appetite change, chills, fatigue and fever.  HENT:  Negative for hearing loss, mouth sores and sore throat.   Eyes:  Negative for redness and visual disturbance.  Respiratory:  Negative for cough, shortness of breath and wheezing.   Cardiovascular:  Negative for chest pain, palpitations and leg swelling.  Gastrointestinal:  Negative for abdominal pain,  nausea and vomiting.       No changes in bowel habits.  Endocrine: Negative for cold intolerance, heat intolerance, polydipsia, polyphagia and polyuria.  Genitourinary:  Negative for decreased urine volume, dysuria, hematuria, vaginal bleeding and vaginal discharge.  Musculoskeletal:  Negative for gait problem and myalgias.  Skin:  Positive for rash (acne). Negative for color change.  Allergic/Immunologic: Positive for environmental allergies.  Neurological:  Negative for syncope, weakness and headaches.  Hematological:  Negative for adenopathy. Does not bruise/bleed easily.  Psychiatric/Behavioral:  Negative for confusion. The patient is not nervous/anxious.   All other systems reviewed and are negative.  Current Outpatient Medications on File Prior to Visit  Medication Sig Dispense Refill   Multiple Vitamins-Minerals (MULTIVITAMIN ADULT PO) Take by mouth daily.     No current facility-administered medications on file prior to visit.   Past Medical History:  Diagnosis Date   Allergy    Past Surgical History:  Procedure Laterality Date   CESAREAN SECTION     02/03/2004 and 12/22/2005   TUBAL LIGATION Bilateral 12/22/2005   Allergies  Allergen Reactions   Shellfish Allergy    Family History  Problem Relation Age of Onset   Diabetes Father    Diabetes Paternal Aunt    Diabetes Paternal Uncle    Cancer Maternal Grandmother        breast   Breast cancer Maternal Grandmother    Diabetes Paternal Grandfather    Social History   Socioeconomic History   Marital status: Married    Spouse name: Not on file   Number of children: Not on file  Years of education: Not on file   Highest education level: Associate degree: academic program  Occupational History   Not on file  Tobacco Use   Smoking status: Former   Smokeless tobacco: Never  Substance and Sexual Activity   Alcohol use: No   Drug use: No   Sexual activity: Yes    Birth control/protection: Surgical  Other Topics  Concern   Not on file  Social History Narrative   Not on file   Social Determinants of Health   Financial Resource Strain: Low Risk  (11/03/2022)   Overall Financial Resource Strain (CARDIA)    Difficulty of Paying Living Expenses: Not very hard  Food Insecurity: No Food Insecurity (11/03/2022)   Hunger Vital Sign    Worried About Running Out of Food in the Last Year: Never true    Ran Out of Food in the Last Year: Never true  Transportation Needs: No Transportation Needs (11/03/2022)   PRAPARE - Administrator, Civil Service (Medical): No    Lack of Transportation (Non-Medical): No  Physical Activity: Sufficiently Active (11/03/2022)   Exercise Vital Sign    Days of Exercise per Week: 6 days    Minutes of Exercise per Session: 60 min  Stress: No Stress Concern Present (11/03/2022)   [employer] of Occupational Health - Occupational Stress Questionnaire    Feeling of Stress : Not at all  Social Connections: Moderately Isolated (11/03/2022)   Social Connection and Isolation Panel [NHANES]    Frequency of Communication with Friends and Family: More than three times a week    Frequency of Social Gatherings with Friends and Family: Once a week    Attends Religious Services: Never    Database administrator or Organizations: No    Attends Engineer, structural: Not on file    Marital Status: Married   Vitals:   11/07/22 1150  BP: 120/80  Pulse: 73  Resp: 12  Temp: 98.9 F (37.2 C)  SpO2: 97%  Body mass index is 27.07 kg/m. Wt Readings from Last 3 Encounters:  11/07/22 150 lb 6 oz (68.2 kg)  10/06/21 147 lb (66.7 kg)  03/26/21 141 lb (64 kg)  Physical Exam Vitals and nursing note reviewed.  Constitutional:      General: She is not in acute distress.    Appearance: She is well-developed.  HENT:     Head: Normocephalic and atraumatic.     Right Ear: Hearing, tympanic membrane, ear canal and external ear normal.     Left Ear: Hearing, tympanic  membrane, ear canal and external ear normal.     Mouth/Throat:     Mouth: Mucous membranes are moist.     Pharynx: Oropharynx is clear. Uvula midline.  Eyes:     Extraocular Movements: Extraocular movements intact.     Conjunctiva/sclera: Conjunctivae normal.     Pupils: Pupils are equal, round, and reactive to light.  Neck:     Thyroid: No thyromegaly.     Trachea: No tracheal deviation.  Cardiovascular:     Rate and Rhythm: Normal rate and regular rhythm.     Pulses:          Dorsalis pedis pulses are 2+ on the right side and 2+ on the left side.     Heart sounds: No murmur heard. Pulmonary:     Effort: Pulmonary effort is normal. No respiratory distress.     Breath sounds: Normal breath sounds.  Abdominal:     Palpations:  Abdomen is soft. There is no hepatomegaly or mass.     Tenderness: There is no abdominal tenderness.  Genitourinary:    Comments: No concerns. Musculoskeletal:     Comments: No major deformity or signs of synovitis appreciated.  Lymphadenopathy:     Cervical: No cervical adenopathy.     Upper Body:     Right upper body: No supraclavicular or axillary adenopathy.     Left upper body: No supraclavicular or axillary adenopathy.  Skin:    General: Skin is warm.     Findings: Rash (acne lesions on face and upper back) present. No erythema. Rash is papular and pustular.  Neurological:     General: No focal deficit present.     Mental Status: She is alert and oriented to person, place, and time.     Cranial Nerves: No cranial nerve deficit.     Coordination: Coordination normal.     Gait: Gait normal.     Deep Tendon Reflexes:     Reflex Scores:      Bicep reflexes are 2+ on the right side and 2+ on the left side.      Patellar reflexes are 2+ on the right side and 2+ on the left side. Psychiatric:        Mood and Affect: Mood and affect normal.   ASSESSMENT AND PLAN: Ms. Karen Johns was here today annual physical examination.  Orders Placed This  Encounter  Procedures   MM DIAG BREAST TOMO BILATERAL   Ambulatory referral to Obstetrics / Gynecology   Routine general medical examination at a health care facility We discussed the importance of regular physical activity and healthy diet for prevention of chronic illness and/or complications. Preventive guidelines reviewed. Vaccination up to date. Declined flu vaccine, hx of adverse effect. Labs done regularly at the "hormonal clinic",including lipid panel and glucose. Next CPE in a year.  Abnormal mammogram -     MM DIAG BREAST TOMO BILATERAL; Future  Infertility associated with anovulation We discussed risk of pregnancy at her age,she really would like to try, so referral to infertility specialist will be arranged. Recommend folic acid supplementation.  -     Ambulatory referral to Obstetrics / Gynecology  Acne, unspecified acne type Because trying to get pregnant recommend avoiding topical retinol. She can continue topical clindamycin. Continue following with dermatologist as needed.  -     clindamycin (CLINDAGEL) 1 % gel; Apply topically 2 (two) times daily.  Return in 1 year (on 11/08/2023) for CPE.  Karen Johns G. [place], MD  [facility]. [place] office.

## 2022-11-07 ENCOUNTER — Ambulatory Visit (INDEPENDENT_AMBULATORY_CARE_PROVIDER_SITE_OTHER): Payer: BC Managed Care – PPO | Admitting: Family Medicine

## 2022-11-07 ENCOUNTER — Encounter: Payer: Self-pay | Admitting: Family Medicine

## 2022-11-07 VITALS — BP 120/80 | HR 73 | Temp 98.9°F | Resp 12 | Ht 62.5 in | Wt 150.4 lb

## 2022-11-07 DIAGNOSIS — Z13220 Encounter for screening for lipoid disorders: Secondary | ICD-10-CM

## 2022-11-07 DIAGNOSIS — N970 Female infertility associated with anovulation: Secondary | ICD-10-CM

## 2022-11-07 DIAGNOSIS — Z0000 Encounter for general adult medical examination without abnormal findings: Secondary | ICD-10-CM | POA: Diagnosis not present

## 2022-11-07 DIAGNOSIS — L709 Acne, unspecified: Secondary | ICD-10-CM

## 2022-11-07 DIAGNOSIS — R928 Other abnormal and inconclusive findings on diagnostic imaging of breast: Secondary | ICD-10-CM | POA: Diagnosis not present

## 2022-11-07 DIAGNOSIS — Z130 Encounter for screening for diseases of the blood and blood-forming organs and certain disorders involving the immune mechanism: Secondary | ICD-10-CM

## 2022-11-07 NOTE — Patient Instructions (Addendum)
A few things to remember from today's visit:  Routine general medical examination at a health care facility  Screening for lipoid disorders  Screening for endocrine, metabolic and immunity disorder  Abnormal mammogram - Plan: Mammogram Digital Diagnostic Bilateral  Infertility associated with anovulation - Plan: Ambulatory referral to Obstetrics / Gynecology  If you need refills for medications you take chronically, please call your pharmacy. Do not use My Chart to request refills or for acute issues that need immediate attention. If you send a my chart message, it may take a few days to be addressed, specially if I am not in the office.  Please be sure medication list is accurate. If a new problem present, please set up appointment sooner than planned today.  Health Maintenance, Female Adopting a healthy lifestyle and getting preventive care are important in promoting health and wellness. Ask your health care provider about: The right schedule for you to have regular tests and exams. Things you can do on your own to prevent diseases and keep yourself healthy. What should I know about diet, weight, and exercise? Eat a healthy diet  Eat a diet that includes plenty of vegetables, fruits, low-fat dairy products, and lean protein. Do not eat a lot of foods that are high in solid fats, added sugars, or sodium. Maintain a healthy weight Body mass index (BMI) is used to identify weight problems. It estimates body fat based on height and weight. Your health care provider can help determine your BMI and help you achieve or maintain a healthy weight. Get regular exercise Get regular exercise. This is one of the most important things you can do for your health. Most adults should: Exercise for at least 150 minutes each week. The exercise should increase your heart rate and make you sweat (moderate-intensity exercise). Do strengthening exercises at least twice a week. This is in addition to the  moderate-intensity exercise. Spend less time sitting. Even light physical activity can be beneficial. Watch cholesterol and blood lipids Have your blood tested for lipids and cholesterol at 44 years of age, then have this test every 5 years. Have your cholesterol levels checked more often if: Your lipid or cholesterol levels are high. You are older than 44 years of age. You are at high risk for heart disease. What should I know about cancer screening? Depending on your health history and family history, you may need to have cancer screening at various ages. This may include screening for: Breast cancer. Cervical cancer. Colorectal cancer. Skin cancer. Lung cancer. What should I know about heart disease, diabetes, and high blood pressure? Blood pressure and heart disease High blood pressure causes heart disease and increases the risk of stroke. This is more likely to develop in people who have high blood pressure readings or are overweight. Have your blood pressure checked: Every 3-5 years if you are 31-50 years of age. Every year if you are 49 years old or older. Diabetes Have regular diabetes screenings. This checks your fasting blood sugar level. Have the screening done: Once every three years after age 71 if you are at a normal weight and have a low risk for diabetes. More often and at a younger age if you are overweight or have a high risk for diabetes. What should I know about preventing infection? Hepatitis B If you have a higher risk for hepatitis B, you should be screened for this virus. Talk with your health care provider to find out if you are at risk for hepatitis  B infection. Hepatitis C Testing is recommended for: Everyone born from [birth year] through 1963. Anyone with known risk factors for hepatitis C. Sexually transmitted infections (STIs) Get screened for STIs, including gonorrhea and chlamydia, if: You are sexually active and are younger than 44 years of age. You are  older than 44 years of age and your health care provider tells you that you are at risk for this type of infection. Your sexual activity has changed since you were last screened, and you are at increased risk for chlamydia or gonorrhea. Ask your health care provider if you are at risk. Ask your health care provider about whether you are at high risk for HIV. Your health care provider may recommend a prescription medicine to help prevent HIV infection. If you choose to take medicine to prevent HIV, you should first get tested for HIV. You should then be tested every 3 months for as long as you are taking the medicine. Pregnancy If you are about to stop having your period (premenopausal) and you may become pregnant, seek counseling before you get pregnant. Take 400 to 800 micrograms (mcg) of folic acid every day if you become pregnant. Ask for birth control (contraception) if you want to prevent pregnancy. Osteoporosis and menopause Osteoporosis is a disease in which the bones lose minerals and strength with aging. This can result in bone fractures. If you are 3 years old or older, or if you are at risk for osteoporosis and fractures, ask your health care provider if you should: Be screened for bone loss. Take a calcium or vitamin D supplement to lower your risk of fractures. Be given hormone replacement therapy (HRT) to treat symptoms of menopause. Follow these instructions at home: Alcohol use Do not drink alcohol if: Your health care provider tells you not to drink. You are pregnant, may be pregnant, or are planning to become pregnant. If you drink alcohol: Limit how much you have to: 0-1 drink a day. Know how much alcohol is in your drink. In the [place] one drink equals one 12 oz bottle of beer (355 mL), one 5 oz glass of wine (148 mL), or one 1 oz glass of hard liquor (44 mL). Lifestyle Do not use any products that contain nicotine or tobacco. These products include cigarettes, chewing  tobacco, and vaping devices, such as e-cigarettes. If you need help quitting, ask your health care provider. Do not use street drugs. Do not share needles. Ask your health care provider for help if you need support or information about quitting drugs. General instructions Schedule regular health, dental, and eye exams. Stay current with your vaccines. Tell your health care provider if: You often feel depressed. You have ever been abused or do not feel safe at home. Summary Adopting a healthy lifestyle and getting preventive care are important in promoting health and wellness. Follow your health care provider's instructions about healthy diet, exercising, and getting tested or screened for diseases. Follow your health care provider's instructions on monitoring your cholesterol and blood pressure. This information is not intended to replace advice given to you by your health care provider. Make sure you discuss any questions you have with your health care provider. Document Revised: 04/26/2021 Document Reviewed: 04/26/2021 Elsevier Patient Education  [place].

## 2022-11-09 ENCOUNTER — Other Ambulatory Visit: Payer: Self-pay | Admitting: Family Medicine

## 2022-11-09 DIAGNOSIS — R928 Other abnormal and inconclusive findings on diagnostic imaging of breast: Secondary | ICD-10-CM

## 2022-11-09 MED ORDER — CLINDAMYCIN PHOSPHATE 1 % EX GEL: Freq: Two times a day (BID) | CUTANEOUS | 3 refills | Status: DC

## 2022-11-16 DIAGNOSIS — M9903 Segmental and somatic dysfunction of lumbar region: Secondary | ICD-10-CM | POA: Diagnosis not present

## 2022-11-16 DIAGNOSIS — M9905 Segmental and somatic dysfunction of pelvic region: Secondary | ICD-10-CM | POA: Diagnosis not present

## 2022-11-16 DIAGNOSIS — M9902 Segmental and somatic dysfunction of thoracic region: Secondary | ICD-10-CM | POA: Diagnosis not present

## 2022-11-16 DIAGNOSIS — M5431 Sciatica, right side: Secondary | ICD-10-CM | POA: Diagnosis not present

## 2022-11-21 ENCOUNTER — Ambulatory Visit: Payer: BC Managed Care – PPO | Admitting: Obstetrics and Gynecology

## 2022-11-21 ENCOUNTER — Other Ambulatory Visit (HOSPITAL_COMMUNITY)
Admission: RE | Admit: 2022-11-21 | Discharge: 2022-11-21 | Disposition: A | Discharge status: Home / Self Care | Payer: BC Managed Care – PPO | Source: Ambulatory Visit | Attending: Obstetrics and Gynecology | Admitting: Obstetrics and Gynecology

## 2022-11-21 ENCOUNTER — Encounter: Payer: Self-pay | Admitting: Obstetrics and Gynecology

## 2022-11-21 VITALS — BP 110/64 | HR 62 | Ht 62.0 in | Wt 153.0 lb

## 2022-11-21 DIAGNOSIS — Z3169 Encounter for other general counseling and advice on procreation: Secondary | ICD-10-CM

## 2022-11-21 DIAGNOSIS — N912 Amenorrhea, unspecified: Secondary | ICD-10-CM

## 2022-11-21 DIAGNOSIS — Z01419 Encounter for gynecological examination (general) (routine) without abnormal findings: Secondary | ICD-10-CM

## 2022-11-21 DIAGNOSIS — N970 Female infertility associated with anovulation: Secondary | ICD-10-CM | POA: Diagnosis not present

## 2022-11-21 DIAGNOSIS — Z124 Encounter for screening for malignant neoplasm of cervix: Secondary | ICD-10-CM | POA: Diagnosis not present

## 2022-11-21 DIAGNOSIS — N9089 Other specified noninflammatory disorders of vulva and perineum: Secondary | ICD-10-CM

## 2022-11-21 LAB — PREGNANCY, URINE: Preg Test, Ur: NEGATIVE

## 2022-11-21 MED ORDER — MEDROXYPROGESTERONE ACETATE 5 MG PO TABS: ORAL_TABLET | ORAL | 0 refills | Status: DC

## 2022-11-21 NOTE — Progress Notes (Unsigned)
46 y.o. G35P2002 Married Other or two or more races Hispanic or Latino female here for Infertility associated with anovulation.  She is also due for an annual exam.  She had her last period in feb and then had spotting in June that lasted about 3 days She is a Warden/ranger. Her body weights has drastic swings.   She did take Testosterone but stopped injections in November. She is still using a cream.   She was having regular cycles up until February. She started competitive training in January.  In June she started having spotting, but also started testosterone.  Stop testosterone injections last month,   She typically gets down to 6-7% body fat with training and she typically looses her cycle for a few months. Competed 2020, 2021, 2022. Lost her cycle for 2-3 months each time.  No vasomotor symptoms, no vaginal dryness, some fatigue, no constipation, no dry skin, no galactorrhea.  She had a tubal reversal in 2021, trying to get pregnant since then. She has not had an HSG.   H/O 2 FT C/S.     Patient's last menstrual period was 02/04/2022.          Sexually active: Yes.    The current method of family planning is none.    Exercising: Yes.    Gym/ health club routine includes cardio and mod to heavy weightlifting. Smoker:  no  Health Maintenance: Pap:  07/20/18 WNL HR HPV Neg History of abnormal Pap:  no MMG:  01/20/21 diag tomo bi-rads 3 probably benign, has an appointment next week.  BMD:   n/a Colonoscopy: none TDaP:  08/15/13 Gardasil: none    reports that she has quit smoking. She has never used smokeless tobacco. She reports that she does not drink alcohol and does not use drugs. Sons are 26 and 16 in [place]. She owns a gym.   Past Medical History:  Diagnosis Date   Allergy    Amenorrhea     Past Surgical History:  Procedure Laterality Date   CESAREAN SECTION     02/03/2004 and 12/22/2005   TUBAL LIGATION Bilateral 12/22/2005   Tubal reversal       Current Outpatient Medications  Medication Sig Dispense Refill   clindamycin (CLINDAGEL) 1 % gel Apply topically 2 (two) times daily. 30 g 3   Multiple Vitamins-Minerals (MULTIVITAMIN ADULT PO) Take by mouth daily.     UNABLE TO FIND Med Name: testosterone cream.     No current facility-administered medications for this visit.    Family History  Problem Relation Age of Onset   Diabetes Father    Diabetes Paternal Aunt    Diabetes Paternal Uncle    Cancer Maternal Grandmother        breast   Breast cancer Maternal Grandmother    Diabetes Paternal Grandfather     Review of Systems  Genitourinary:  Positive for menstrual problem.  All other systems reviewed and are negative.   Exam:   BP 110/64   Pulse 62   Ht 5\' 2"  (1.575 m)   Wt 153 lb (69.4 kg)   LMP 02/04/2022   SpO2 100%   BMI 27.98 kg/m   Weight change: @WEIGHTCHANGE @ Height:   Height: 5\' 2"  (157.5 cm)  Ht Readings from Last 3 Encounters:  11/21/22 5\' 2"  (1.575 m)  11/07/22 5' 2.5" (1.588 m)  10/06/21 5' 2.5" (1.588 m)    General appearance: alert, cooperative and appears stated age Head: Normocephalic, without obvious abnormality, atraumatic  Neck: no adenopathy, supple, symmetrical, trachea midline and thyroid normal to inspection and palpation Lungs: clear to auscultation bilaterally Cardiovascular: regular rate and rhythm Breasts: normal appearance, no masses or tenderness Abdomen: soft, non-tender; non distended,  no masses,  no organomegaly Extremities: extremities normal, atraumatic, no cyanosis or edema Skin: Skin color, texture, turgor normal. No rashes or lesions Lymph nodes: Cervical, supraclavicular, and axillary nodes normal. No abnormal inguinal nodes palpated Neurologic: Grossly normal   Pelvic: External genitalia:  no lesions, her clitoris appears mildly enlarged (on questioning she noticed this change with testosterone therapy)              Urethra:  normal appearing urethra with no  masses, tenderness or lesions              Bartholins and Skenes: normal                 Vagina: mildly atrophic appearing vagina with normal color and discharge, no lesions              Cervix: no lesions               Bimanual Exam:  Uterus:  normal size, contour, position, consistency, mobility, non-tender              Adnexa: no mass, fullness, tenderness               Rectovaginal: Confirms               Anus:  normal sphincter tone, no lesions  Carolynn Serve, CMA chaperoned for the exam.  1. Well woman exam Discussed breast self exam Mammogram scheduled Screening labs with primary   2. Screening for cervical cancer - Cytology - PAP  3. 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 Infertility counseling Recommended that she stop testosterone therapy [facility] Will refer to Infertility Clinic Depending on her lab work, may order an HSG

## 2022-11-21 NOTE — Patient Instructions (Signed)
EXERCISE   We recommended that you start or continue a regular exercise program for good health. Physical activity is anything that gets your body moving, some is better than none. The CDC recommends 150 minutes per week of Moderate-Intensity Aerobic Activity and 2 or more days of Muscle Strengthening Activity.  Benefits of exercise are limitless: helps weight loss/weight maintenance, improves mood and energy, helps with depression and anxiety, improves sleep, tones and strengthens muscles, improves balance, improves bone density, protects from chronic conditions such as heart disease, high blood pressure and diabetes and so much more. To learn more visit: https://www.cdc.gov/physicalactivity/index.html  DIET: Good nutrition starts with a healthy diet of fruits, vegetables, whole grains, and lean protein sources. Drink plenty of water for hydration. Minimize empty calories, sodium, sweets. For more information about dietary recommendations visit: https://health.gov/our-work/nutrition-physical-activity/dietary-guidelines and https://www.myplate.gov/  ALCOHOL:  Women should limit their alcohol intake to no more than 7 drinks/beers/glasses of wine (combined, not each!) per week. Moderation of alcohol intake to this level decreases your risk of breast cancer and liver damage.  If you are concerned that you may have a problem, or your friends have told you they are concerned about your drinking, there are many resources to help. A well-known program that is free, effective, and available to all people all over the nation is Alcoholics Anonymous.  Check out this site to learn more: https://www.aa.org/   CALCIUM AND VITAMIN D:  Adequate intake of calcium and Vitamin D are recommended for bone health.  You should be getting between 1000-1200 mg of calcium and 800 units of Vitamin D daily between diet and supplements  PAP SMEARS:  Pap smears, to check for cervical cancer or precancers,  have traditionally been  done yearly, scientific advances have shown that most women can have pap smears less often.  However, every woman still should have a physical exam from her gynecologist every year. It will include a breast check, inspection of the vulva and vagina to check for abnormal growths or skin changes, a visual exam of the cervix, and then an exam to evaluate the size and shape of the uterus and ovaries. We will also provide age appropriate advice regarding health maintenance, like when you should have certain vaccines, screening for sexually transmitted diseases, bone density testing, colonoscopy, mammograms, etc.   MAMMOGRAMS:  All women over 40 years old should have a routine mammogram.   COLON CANCER SCREENING: Now recommend starting at age 45. At this time colonoscopy is not covered for routine screening until 50. There are take home tests that can be done between 45-49.   COLONOSCOPY:  Colonoscopy to screen for colon cancer is recommended for all women at age 50.  We know, you hate the idea of the prep.  We agree, BUT, having colon cancer and not knowing it is worse!!  Colon cancer so often starts as a polyp that can be seen and removed at colonscopy, which can quite literally save your life!  And if your first colonoscopy is normal and you have no family history of colon cancer, most women don't have to have it again for 10 years.  Once every ten years, you can do something that may end up saving your life, right?  We will be happy to help you get it scheduled when you are ready.  Be sure to check your insurance coverage so you understand how much it will cost.  It may be covered as a preventative service at no cost, but you should check   your particular policy.      Breast Self-Awareness Breast self-awareness means being familiar with how your breasts look and feel. It involves checking your breasts regularly and reporting any changes to your health care provider. Practicing breast self-awareness is  important. A change in your breasts can be a sign of a serious medical problem. Being familiar with how your breasts look and feel allows you to find any problems early, when treatment is more likely to be successful. All women should practice breast self-awareness, including women who have had breast implants. How to do a breast self-exam One way to learn what is normal for your breasts and whether your breasts are changing is to do a breast self-exam. To do a breast self-exam: Look for Changes  Remove all the clothing above your waist. Stand in front of a mirror in a room with good lighting. Put your hands on your hips. Push your hands firmly downward. Compare your breasts in the mirror. Look for differences between them (asymmetry), such as: Differences in shape. Differences in size. Puckers, dips, and bumps in one breast and not the other. Look at each breast for changes in your skin, such as: Redness. Scaly areas. Look for changes in your nipples, such as: Discharge. Bleeding. Dimpling. Redness. A change in position. Feel for Changes Carefully feel your breasts for lumps and changes. It is best to do this while lying on your back on the floor and again while sitting or standing in the shower or tub with soapy water on your skin. Feel each breast in the following way: Place the arm on the side of the breast you are examining above your head. Feel your breast with the other hand. Start in the nipple area and make  inch (2 cm) overlapping circles to feel your breast. Use the pads of your three middle fingers to do this. Apply light pressure, then medium pressure, then firm pressure. The light pressure will allow you to feel the tissue closest to the skin. The medium pressure will allow you to feel the tissue that is a little deeper. The firm pressure will allow you to feel the tissue close to the ribs. Continue the overlapping circles, moving downward over the breast until you feel your  ribs below your breast. Move one finger-width toward the center of the body. Continue to use the  inch (2 cm) overlapping circles to feel your breast as you move slowly up toward your collarbone. Continue the up and down exam using all three pressures until you reach your armpit.  Write Down What You Find  Write down what is normal for each breast and any changes that you find. Keep a written record with breast changes or normal findings for each breast. By writing this information down, you do not need to depend only on memory for size, tenderness, or location. Write down where you are in your menstrual cycle, if you are still menstruating. If you are having trouble noticing differences in your breasts, do not get discouraged. With time you will become more familiar with the variations in your breasts and more comfortable with the exam. How often should I examine my breasts? Examine your breasts every month. If you are breastfeeding, the best time to examine your breasts is after a feeding or after using a breast pump. If you menstruate, the best time to examine your breasts is 5-7 days after your period is over. During your period, your breasts are lumpier, and it may be more   difficult to notice changes. When should I see my health care provider? See your health care provider if you notice: A change in shape or size of your breasts or nipples. A change in the skin of your breast or nipples, such as a reddened or scaly area. Unusual discharge from your nipples. A lump or thick area that was not there before. Pain in your breasts. Anything that concerns you. Female Infertility  Female infertility refers to a woman's inability to get pregnant (conceive) after a year of having sex regularly (or after 6 months in women over age 96) without using birth control. Infertility can also mean that a woman is not able to carry a pregnancy to full term. Both women and men can experience fertility  problems. What are the causes? This condition may be caused by: Problems with reproductive organs. Infertility can result if a woman: Is not ovulating or is ovulating irregularly. Has a blockage or scarring in the fallopian tubes. Has uterine fibroids. This is a benign mass of tissue or muscle (tumor) that can develop in the uterus. Has an abnormally shaped uterus. Has an abnormally short cervix or a cervix that does not remain closed during a pregnancy. Certain medical conditions. These may include: Polycystic ovary syndrome (PCOS). This is a hormonal disorder that can cause small cysts to grow on the ovaries. This is the most common cause of infertility in women. Endometriosis. This is a condition in which the tissue that lines the uterus (endometrium) grows outside of its normal location. Cancer and cancer treatments, such as chemotherapy or radiation. Premature ovarian failure. This is when ovaries stop producing eggs and hormones before age 73. Sexually transmitted infections, such as chlamydia or gonorrhea. Autoimmune disorders. These are disorders in which the body's defense system (immune system) attacks normal, healthy cells. Infertility can be linked to more than one cause. For some women, the cause of infertility is not known (unexplained infertility). What increases the risk? The following factors may make you more likely to develop this condition: Age. A woman's fertility declines with age, especially after her mid-30s. Stress. Smoking. Being underweight or overweight. Drinking too much alcohol. Using drugs such as anabolic steroids, cocaine, and marijuana. Exercising excessively. What are the signs or symptoms? The main sign of infertility in women is the inability to get pregnant or carry a pregnancy to full term. How is this diagnosed? This condition may be diagnosed by: Checking whether you are ovulating each month. The tests may include: Blood tests to check hormone  levels. An ultrasound of the ovaries. Taking a sample of the tissue that lines the uterus and checking it under a microscope (endometrial biopsy). Doing additional tests. This is done if ovulation is normal. Tests may include: Hysterosalpingogram. This X-ray test can show the shape of the uterus and whether the fallopian tubes are open. Laparoscopy. This test uses a lighted tube (laparoscope) to look for problems in the fallopian tubes and other organs. Transvaginal ultrasound. This imaging test is used to check for abnormalities in the uterus and ovaries. Hysteroscopy. This test uses a lighted tube to check for problems in the cervix and the uterus. To be diagnosed with infertility, both partners will have a physical exam. Both partners will also have an extensive medical and sexual history taken. Additional tests may be done. How is this treated? Treatment depends on the cause of infertility. Most cases of infertility in women are treated with medicine or surgery. Women may take medicine to: Correct ovulation problems.  Treat other health conditions. Surgery may be done to: Repair damage to the ovaries, fallopian tubes, cervix, or uterus. Remove growths from the uterus. Remove scar tissue from the uterus, pelvis, or other organs. Assisted reproductive technology (ART) Assisted reproductive technology (ART) refers to all treatments and procedures that combine eggs and sperm outside the body to try to help a couple conceive. ART is often combined with fertility drugs to stimulate ovulation. Sometimes ART is done using eggs retrieved from another woman's body (donor eggs) or from previously frozen fertilized eggs (embryos). There are different types of ART. These include: Intrauterine insemination (IUI). A long, thin tube is used to place sperm directly into a woman's uterus. This procedure: Is effective for infertility caused by sperm problems, including low sperm count and low motility. Can be  used in combination with fertility drugs. In vitro fertilization (IVF). This is done when a woman's fallopian tubes are blocked or when a man has low sperm count. In this procedure: Fertility drugs are used to stimulate the ovaries to produce multiple eggs. Once mature, these eggs are removed from the body and combined with the sperm to be fertilized. The fertilized eggs are then placed into the woman's uterus. Follow these instructions at home: Lifestyle If you drink alcohol, limit how much you have to 0-1 drink a day. Do not use any products that contain nicotine or tobacco. These products include cigarettes, chewing tobacco, and vaping devices, such as e-cigarettes. If you need help quitting, ask your health care provider. Practice stress reduction techniques that work well for you, such as regular physical activity, meditation, or deep breathing. Make dietary changes to lose weight or maintain a healthy weight. Work with your health care provider and a dietitian to set a weight-loss goal that is healthy and reasonable for you. General instructions Take over-the-counter and prescription medicines only as told by your health care provider. Seek support from a counselor or support group to talk about your concerns related to infertility. Couples counseling may be helpful for you and your partner. Keep all follow-up visits. This is important. Where to find support Resolve - The National Infertility Association: resolve.org Contact a health care provider if: You feel that stress is interfering with your life and relationships. You have side effects from treatments for infertility. Summary Female infertility refers to a woman's inability to get pregnant (conceive) after a year of having sex regularly (or after 6 months in women over age 82) without using birth control. To be diagnosed with infertility, both partners will have a physical exam. Both partners will also have an extensive medical and  sexual history taken. Seek support from a counselor or support group to talk about your concerns related to infertility. Couples counseling may be helpful for you and your partner. This information is not intended to replace advice given to you by your health care provider. Make sure you discuss any questions you have with your health care provider. Document Revised: 02/02/2021 Document Reviewed: 02/02/2021 Elsevier Patient Education  2023 [employer] Hysterosalpingography  Hysterosalpingography is a procedure to look inside a woman's womb (uterus) and fallopian tubes. During this procedure, contrast dye is injected into the uterus through the vagina and cervix. X-rays are then taken. The dye makes the uterus and fallopian tubes show up clearly on the X-rays and may show whether the tubes are blocked, scarred, or if there are any other abnormalities. This procedure may be done: To determine if there are tumors, scars, or other abnormalities in  the uterus. To determine why a woman is unable to get pregnant or has had repeated pregnancy losses. To make sure the fallopian tubes are completely blocked a few months after having certain tubal sterilization procedures. Tell a health care provider about: Any allergies you have. All medicines you are taking, including vitamins, herbs, eye drops, creams, and over-the-counter medicines. Any problems you or family members have had with anesthetic medicines. Any bleeding problems you have. Any surgeries you have had. Any medical conditions you have. Whether you are pregnant or may be pregnant. Whether you have been diagnosed with a sexually transmitted infection (STI) or you think you have an STI. What are the risks? Generally, this is a safe procedure. However, problems may occur, including: Infection in the lining of the uterus or fallopian tubes. Allergic reaction to medicines or dyes. A hole (perforation) in the uterus or fallopian tubes. Damage  to other structures or organs. What happens before the procedure? Medicines Ask your health care provider about: Changing or stopping your regular medicines. This is especially important if you are taking diabetes medicines or blood thinners. Taking medicines such as aspirin and ibuprofen. These medicines can thin your blood. Do not take them unless your health care provider tells you to. Taking over-the-counter medicines, vitamins, herbs, and supplements. General instructions Schedule the procedure after your menstrual period stops, but before your next ovulation. This is usually between day 5 and day 10 of your last period. Day 1 is the first day of your period. Empty your bladder before the procedure begins. If you will be going home right after the procedure, plan to have a responsible adult: Take you home from the hospital or clinic. You will not be allowed to drive. Care for you for the time you are told. What happens during the procedure? You may be given: A medicine to help you relax (sedative). A medicine to numb the area (local anesthetic). An over-the-counter pain medicine. You will lie down on your back and place your feet into footrests (stirrups). A device called a speculum will be inserted into your vagina. This allows your health care provider to see inside your vagina through to your cervix. Your cervix will be washed with a germ-killing soap. A medicine may be injected into your cervix to numb it. A thin, flexible tube will be passed through your cervix into your uterus. Contrast dye will be passed through the tube and into the uterus. This may cause cramping. Several X-rays will be taken as the contrast dye spreads through the uterus and into the fallopian tubes. You may be asked to change your position and roll from side to side if needed. The tube will be removed. The contrast dye will flow out through your vagina. The procedure may vary among health care providers and  hospitals. What can I expect after procedure? Your blood pressure, heart rate, breathing rate, and blood oxygen level will be monitored until you leave the hospital or clinic. Most of the contrast dye will flow out of your vagina. This fluid will be sticky and may have blood in it. You may want to wear a sanitary pad. Do not use a tampon. It is up to you to get the results of your procedure. Ask your health care provider, or the department that is doing the procedure, when your results will be ready. Contact a health care provider if: You have a fever or chills. You faint. You have severe cramping. Your skin has a rash, and is  itchy or swollen. You have a bad-smelling vaginal discharge. You have vaginal bleeding that lasts for more than 4 days. Get help right away if: You have nausea and vomiting. You have heavy vaginal bleeding that soaks more than one pad every hour. You have severe abdominal pain. These symptoms may be an emergency. Get help right away. Call 911. Do not wait to see if the symptoms will go away. Do not drive yourself to the hospital. Summary Hysterosalpingography is a procedure in which a woman's uterus and fallopian tubes are examined. During this procedure, contrast dye is injected into the uterus through the vagina and cervix. X-rays are then taken. The dye helps the uterus and fallopian tubes show up clearly on the X-rays. Schedule the procedure after your menstrual period stops, but before your next ovulation. This is usually between day 5 and day 10 of your last period. It is up to you to get the results of your procedure. Ask your health care provider, or the department that is doing the procedure, when your results will be ready. This information is not intended to replace advice given to you by your health care provider. Make sure you discuss any questions you have with your health care provider. Document Revised: 02/08/2022 Document Reviewed: 02/08/2022 Elsevier  Patient Education  2023 [employer].

## 2022-11-22 LAB — CYTOLOGY - PAP
Comment: NEGATIVE
Diagnosis: NEGATIVE
High risk HPV: NEGATIVE

## 2022-11-24 ENCOUNTER — Other Ambulatory Visit: Payer: Self-pay | Admitting: Family Medicine

## 2022-11-24 DIAGNOSIS — L709 Acne, unspecified: Secondary | ICD-10-CM

## 2022-11-24 DIAGNOSIS — N6320 Unspecified lump in the left breast, unspecified quadrant: Secondary | ICD-10-CM

## 2022-11-24 DIAGNOSIS — Z0000 Encounter for general adult medical examination without abnormal findings: Secondary | ICD-10-CM

## 2022-11-24 DIAGNOSIS — N970 Female infertility associated with anovulation: Secondary | ICD-10-CM

## 2022-11-24 LAB — ANTI-MULLERIAN HORMONE (AMH), FEMALE: Anti-Mullerian Hormones(AMH), Female: 0.06 ng/mL (ref 0.01–2.99)

## 2022-11-25 LAB — TESTOS,TOTAL,FREE AND SHBG (FEMALE)
Free Testosterone: 60.8 pg/mL — ABNORMAL HIGH (ref 0.1–6.4)
Sex Hormone Binding: 20 nmol/L (ref 17–124)
Testosterone, Total, LC-MS-MS: 354 ng/dL — ABNORMAL HIGH (ref 2–45)

## 2022-11-25 LAB — TSH: TSH: 2.17 mIU/L

## 2022-11-25 LAB — FOLLICLE STIMULATING HORMONE: FSH: 44.7 m[IU]/mL

## 2022-11-25 LAB — ESTRADIOL: Estradiol: 33 pg/mL

## 2022-11-25 LAB — PROLACTIN: Prolactin: 6.7 ng/mL

## 2022-11-29 ENCOUNTER — Ambulatory Visit
Admission: RE | Admit: 2022-11-29 | Discharge: 2022-11-29 | Disposition: A | Discharge status: Home / Self Care | Payer: BC Managed Care – PPO | Source: Ambulatory Visit | Attending: Family Medicine | Admitting: Family Medicine

## 2022-11-29 DIAGNOSIS — N6320 Unspecified lump in the left breast, unspecified quadrant: Secondary | ICD-10-CM

## 2022-11-29 DIAGNOSIS — R922 Inconclusive mammogram: Secondary | ICD-10-CM | POA: Diagnosis not present

## 2022-11-29 DIAGNOSIS — N6489 Other specified disorders of breast: Secondary | ICD-10-CM | POA: Diagnosis not present

## 2022-12-09 ENCOUNTER — Encounter: Payer: Self-pay | Admitting: Obstetrics and Gynecology

## 2022-12-09 DIAGNOSIS — N912 Amenorrhea, unspecified: Secondary | ICD-10-CM

## 2022-12-14 ENCOUNTER — Other Ambulatory Visit: Payer: Self-pay | Admitting: *Deleted

## 2022-12-14 DIAGNOSIS — Z3169 Encounter for other general counseling and advice on procreation: Secondary | ICD-10-CM

## 2022-12-23 DIAGNOSIS — M9906 Segmental and somatic dysfunction of lower extremity: Secondary | ICD-10-CM | POA: Diagnosis not present

## 2022-12-23 DIAGNOSIS — M5431 Sciatica, right side: Secondary | ICD-10-CM | POA: Diagnosis not present

## 2022-12-23 DIAGNOSIS — M9905 Segmental and somatic dysfunction of pelvic region: Secondary | ICD-10-CM | POA: Diagnosis not present

## 2022-12-23 DIAGNOSIS — M9902 Segmental and somatic dysfunction of thoracic region: Secondary | ICD-10-CM | POA: Diagnosis not present

## 2022-12-23 DIAGNOSIS — M9903 Segmental and somatic dysfunction of lumbar region: Secondary | ICD-10-CM | POA: Diagnosis not present

## 2022-12-27 MED ORDER — MEDROXYPROGESTERONE ACETATE 5 MG PO TABS: ORAL_TABLET | ORAL | 0 refills | Status: DC

## 2023-01-04 DIAGNOSIS — Z319 Encounter for procreative management, unspecified: Secondary | ICD-10-CM | POA: Diagnosis not present

## 2023-01-05 ENCOUNTER — Encounter (HOSPITAL_COMMUNITY): Payer: Self-pay | Admitting: Nurse Practitioner

## 2023-01-10 ENCOUNTER — Other Ambulatory Visit (HOSPITAL_COMMUNITY): Payer: Self-pay | Admitting: Nurse Practitioner

## 2023-01-10 DIAGNOSIS — N912 Amenorrhea, unspecified: Secondary | ICD-10-CM | POA: Diagnosis not present

## 2023-01-10 DIAGNOSIS — N979 Female infertility, unspecified: Secondary | ICD-10-CM

## 2023-01-10 DIAGNOSIS — Z136 Encounter for screening for cardiovascular disorders: Secondary | ICD-10-CM

## 2023-01-10 DIAGNOSIS — Z3169 Encounter for other general counseling and advice on procreation: Secondary | ICD-10-CM | POA: Diagnosis not present

## 2023-01-18 ENCOUNTER — Telehealth (HOSPITAL_COMMUNITY): Payer: Self-pay | Admitting: *Deleted

## 2023-01-18 NOTE — Telephone Encounter (Signed)
Pt reached and given instructions for ETT scheduled on 01/19/23.

## 2023-01-19 ENCOUNTER — Ambulatory Visit (HOSPITAL_COMMUNITY): Payer: BC Managed Care – PPO | Attending: Nurse Practitioner

## 2023-01-19 DIAGNOSIS — Z136 Encounter for screening for cardiovascular disorders: Secondary | ICD-10-CM | POA: Diagnosis not present

## 2023-01-19 DIAGNOSIS — N979 Female infertility, unspecified: Secondary | ICD-10-CM

## 2023-01-19 LAB — EXERCISE TOLERANCE TEST
Angina Index: 0
Duke Treadmill Score: 15
Estimated workload: 17.2
Exercise duration (min): 14 min
Exercise duration (sec): 30 s
MPHR: 176 {beats}/min
Peak HR: 160 {beats}/min
Percent HR: 90 %
Rest HR: 77 {beats}/min
ST Depression (mm): 0 mm

## 2023-02-13 DIAGNOSIS — Z3169 Encounter for other general counseling and advice on procreation: Secondary | ICD-10-CM | POA: Diagnosis not present

## 2023-02-13 DIAGNOSIS — O09529 Supervision of elderly multigravida, unspecified trimester: Secondary | ICD-10-CM | POA: Diagnosis not present

## 2023-02-13 DIAGNOSIS — Z98891 History of uterine scar from previous surgery: Secondary | ICD-10-CM | POA: Diagnosis not present

## 2023-03-17 ENCOUNTER — Other Ambulatory Visit: Payer: Self-pay | Admitting: Family Medicine

## 2023-03-17 DIAGNOSIS — L709 Acne, unspecified: Secondary | ICD-10-CM

## 2023-04-03 DIAGNOSIS — N979 Female infertility, unspecified: Secondary | ICD-10-CM | POA: Diagnosis not present

## 2023-04-04 ENCOUNTER — Other Ambulatory Visit: Payer: Self-pay | Admitting: Obstetrics and Gynecology

## 2023-04-04 DIAGNOSIS — N912 Amenorrhea, unspecified: Secondary | ICD-10-CM

## 2023-04-04 NOTE — Telephone Encounter (Signed)
Med refill request: Provera Last AEX: 11/21/22, prescribed 12/09/22 for  Next AEX: not scheduled Last MMG (if hormonal med) 11/29/22 Refill authorized: Please Advise?

## 2023-04-11 DIAGNOSIS — J019 Acute sinusitis, unspecified: Secondary | ICD-10-CM | POA: Diagnosis not present

## 2023-04-11 DIAGNOSIS — J209 Acute bronchitis, unspecified: Secondary | ICD-10-CM | POA: Diagnosis not present

## 2023-04-17 DIAGNOSIS — N979 Female infertility, unspecified: Secondary | ICD-10-CM | POA: Diagnosis not present

## 2023-04-18 DIAGNOSIS — N979 Female infertility, unspecified: Secondary | ICD-10-CM | POA: Diagnosis not present

## 2023-04-20 DIAGNOSIS — N83202 Unspecified ovarian cyst, left side: Secondary | ICD-10-CM | POA: Diagnosis not present

## 2023-04-20 DIAGNOSIS — Z319 Encounter for procreative management, unspecified: Secondary | ICD-10-CM | POA: Diagnosis not present

## 2023-04-20 DIAGNOSIS — E288 Other ovarian dysfunction: Secondary | ICD-10-CM | POA: Diagnosis not present

## 2023-07-05 DIAGNOSIS — N979 Female infertility, unspecified: Secondary | ICD-10-CM | POA: Diagnosis not present

## 2023-07-13 DIAGNOSIS — N979 Female infertility, unspecified: Secondary | ICD-10-CM | POA: Diagnosis not present

## 2023-11-10 ENCOUNTER — Other Ambulatory Visit: Payer: Self-pay | Admitting: Family Medicine

## 2023-11-10 DIAGNOSIS — L709 Acne, unspecified: Secondary | ICD-10-CM

## 2023-12-08 ENCOUNTER — Other Ambulatory Visit: Payer: Self-pay | Admitting: Family Medicine

## 2023-12-08 DIAGNOSIS — L709 Acne, unspecified: Secondary | ICD-10-CM

## 2024-01-13 ENCOUNTER — Other Ambulatory Visit: Payer: Self-pay | Admitting: Family Medicine

## 2024-01-13 DIAGNOSIS — L709 Acne, unspecified: Secondary | ICD-10-CM
